# Patient Record
Sex: Male | Born: 1995
Health system: Southern US, Community
[De-identification: ages and names within clinical notes are randomized; demographics above are authoritative.]

## PROBLEM LIST (undated history)

## (undated) DIAGNOSIS — R011 Cardiac murmur, unspecified: Secondary | ICD-10-CM

## (undated) DIAGNOSIS — J45909 Unspecified asthma, uncomplicated: Secondary | ICD-10-CM

## (undated) HISTORY — PX: KNEE ARTHROPLASTY: SHX992

## (undated) HISTORY — PX: TONSILLECTOMY: SUR1361

---
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
## 2013-02-21 ENCOUNTER — Other Ambulatory Visit: Payer: Self-pay

## 2013-02-21 ENCOUNTER — Emergency Department (HOSPITAL_COMMUNITY)
Admission: EM | Admit: 2013-02-21 | Discharge: 2013-02-21 | Disposition: A | Discharge status: Home / Self Care | Payer: BC Managed Care – PPO | Attending: Emergency Medicine | Admitting: Emergency Medicine

## 2013-02-21 ENCOUNTER — Other Ambulatory Visit: Payer: BC Managed Care – PPO

## 2013-02-21 ENCOUNTER — Encounter (HOSPITAL_COMMUNITY): Payer: Self-pay | Admitting: Emergency Medicine

## 2013-02-21 ENCOUNTER — Emergency Department (HOSPITAL_COMMUNITY): Payer: BC Managed Care – PPO

## 2013-02-21 DIAGNOSIS — M545 Low back pain, unspecified: Secondary | ICD-10-CM | POA: Insufficient documentation

## 2013-02-21 DIAGNOSIS — R0789 Other chest pain: Secondary | ICD-10-CM

## 2013-02-21 DIAGNOSIS — R05 Cough: Secondary | ICD-10-CM | POA: Insufficient documentation

## 2013-02-21 DIAGNOSIS — J45901 Unspecified asthma with (acute) exacerbation: Secondary | ICD-10-CM | POA: Insufficient documentation

## 2013-02-21 DIAGNOSIS — R5381 Other malaise: Secondary | ICD-10-CM | POA: Insufficient documentation

## 2013-02-21 DIAGNOSIS — Z791 Long term (current) use of non-steroidal anti-inflammatories (NSAID): Secondary | ICD-10-CM | POA: Insufficient documentation

## 2013-02-21 DIAGNOSIS — R011 Cardiac murmur, unspecified: Secondary | ICD-10-CM | POA: Insufficient documentation

## 2013-02-21 DIAGNOSIS — R5383 Other fatigue: Secondary | ICD-10-CM

## 2013-02-21 DIAGNOSIS — R51 Headache: Secondary | ICD-10-CM | POA: Insufficient documentation

## 2013-02-21 DIAGNOSIS — R071 Chest pain on breathing: Secondary | ICD-10-CM | POA: Insufficient documentation

## 2013-02-21 DIAGNOSIS — R059 Cough, unspecified: Secondary | ICD-10-CM | POA: Insufficient documentation

## 2013-02-21 MED ORDER — ALBUTEROL SULFATE HFA 108 (90 BASE) MCG/ACT IN AERS: 1.0000 | INHALATION_SPRAY | Freq: Four times a day (QID) | RESPIRATORY_TRACT | Status: DC | PRN

## 2013-02-21 MED ORDER — NAPROXEN 500 MG PO TABS: 500.0000 mg | ORAL_TABLET | Freq: Two times a day (BID) | ORAL | Status: DC

## 2013-02-21 NOTE — ED Notes (Signed)
Pt c/o chest tightness and difficult breathing since late last night. Pt also c/o productive cough with green/yellow sputum x1-2 weeks. Pt reports hx of asthma but states he is currently out of his inhaler.

## 2013-02-21 NOTE — Discharge Instructions (Signed)

## 2013-02-21 NOTE — ED Notes (Signed)
nad noted prior to dc. Dc instructions reviewed and explained. Voiced understanding. 1 script given. Family at [facility]sd

## 2013-02-21 NOTE — ED Provider Notes (Signed)
CSN: [id number]     Arrival date & time 02/21/13  0719 History  This chart was scribed for Shelda JakesScott W. Vin Yonke, MD,  by Ashley JacobsBrittany Andrews, ED Scribe. The patient was seen in room APA12/APA12 and the patient's care was started at 7:39 AM.    First MD Initiated Contact with Patient 02/21/13 [phone]     Chief Complaint  Patient presents with  . Chest Pain  . Cough   (Consider location/radiation/quality/duration/timing/severity/associated sxs/prior Treatment) Patient is a 18 y.o. male presenting with chest pain and cough. The history is provided by the patient and medical records. No language interpreter was used.  Chest Pain Associated symptoms: back pain (lumbar), cough, headache and shortness of breath   Associated symptoms: no dizziness, no fever, no nausea and not vomiting   Cough Associated symptoms: chest pain, headaches, myalgias and shortness of breath   Associated symptoms: no chills, no fever and no rash    HPI Comments: Alex Oneill is a 18 y.o. male who presents to the Emergency Department complaining of constant, moderate, sharp chest pain. The pain is 8/10 in severity and worse with breathing or coughing. The pain was intermittent for two weeks prior to yesterday. He has the associated symptoms of SOB, congestion, back pain, body aches, and headaches. Denies dizziness, nausea, chills, and fever. Pt had a cough for one week. Pt has a past medical hx of heart murmur and asthma. Nothing seems to resolve his symptoms. Pt does not have any known allergies to medications.  Past Medical History  Diagnosis Date  . Heart murmur   . Asthma    Past Surgical History  Procedure Laterality Date  . Knee arthroplasty    . Tonsillectomy     No family history on file. History  Substance Use Topics  . Smoking status: Never Smoker   . Smokeless tobacco: Not on file  . Alcohol Use: No    Review of Systems  Constitutional: Negative for fever and chills.  HENT: Positive for congestion.    Eyes: Negative for visual disturbance.  Respiratory: Positive for cough and shortness of breath.   Cardiovascular: Positive for chest pain. Negative for leg swelling.  Gastrointestinal: Negative for nausea, vomiting and diarrhea.  Musculoskeletal: Positive for back pain (lumbar) and myalgias.  Skin: Negative for rash.  Neurological: Positive for headaches. Negative for dizziness.  Hematological: Does not bruise/bleed easily.  Psychiatric/Behavioral: Negative for confusion.  All other systems reviewed and are negative.    Allergies  Review of patient's allergies indicates no known allergies.  Home Medications   Current Outpatient Rx  Name  Route  Sig  Dispense  Refill  . naproxen (NAPROSYN) 500 MG tablet   Oral   Take 1 tablet (500 mg total) by mouth 2 (two) times daily.   14 tablet   0    BP 134/94  Pulse 64  Temp(Src) 98 F (36.7 C) (Oral)  Resp 16  Wt 165 lb (74.844 kg)  SpO2 99% Physical Exam  Nursing note and vitals reviewed. Constitutional: He appears well-developed and well-nourished. No distress.  Cardiovascular: Normal rate, regular rhythm and normal heart sounds.  Exam reveals no gallop and no friction rub.   No murmur heard. Pulmonary/Chest: Breath sounds normal. He has no wheezes. He has no rales. He exhibits tenderness.  Reproducible chest wall tenderness  Abdominal: Soft. Bowel sounds are normal. There is no tenderness. There is no rebound and no guarding.  Musculoskeletal: Normal range of motion. He exhibits no edema and no  tenderness.    ED Course  Procedures (including critical care time) DIAGNOSTIC STUDIES: Oxygen Saturation is 99% on room air, normal by my interpretation.    COORDINATION OF CARE:  7:44 AM Discussed course of care with pt . Pt understands and agrees.   Labs Review Labs Reviewed - No data to display Imaging Review Dg Chest 2 View  02/21/2013   CLINICAL DATA:  Chest pain and productive cough.  EXAM: CHEST  2 VIEW  COMPARISON:   12/01/2011  FINDINGS: The heart size and mediastinal contours are within normal limits. Both lungs are clear. The visualized skeletal structures are unremarkable.  IMPRESSION: Normal chest.   Electronically Signed   By: Geanie Cooley M.D.   On: 02/21/2013 08:11    EKG Interpretation   None      Date: 02/21/2013  Rate: 55  Rhythm: sinus bradycardia and sinus arrhythmia  QRS Axis: normal  Intervals: normal  ST/T Wave abnormalities: normal  Conduction Disutrbances:none  Narrative Interpretation:   Old EKG Reviewed: none available    MDM   1. Chest wall pain     Chest x-ray negative EKG without any acute findings. Patient has reproducible left-sided chest tenderness this is most likely all secondary to chest wall pain soreness from the upper respiratory infection and cough for the past several days. No evidence of pneumonia no evidence of pneumothorax.     I personally performed the services described in this documentation, which was scribed in my presence. The recorded information has been reviewed and is accurate.       Shelda Jakes, MD 02/21/13 [phone]

## 2013-02-21 NOTE — ED Notes (Signed)
C/o sharp cp that began yesterday. Sharp in nature per pt

## 2013-04-25 ENCOUNTER — Telehealth: Payer: Self-pay | Admitting: General Practice

## 2013-04-25 NOTE — Telephone Encounter (Signed)
appt scheduled with mae

## 2013-04-29 ENCOUNTER — Ambulatory Visit (INDEPENDENT_AMBULATORY_CARE_PROVIDER_SITE_OTHER): Payer: BC Managed Care – PPO

## 2013-04-29 ENCOUNTER — Encounter: Payer: Self-pay | Admitting: General Practice

## 2013-04-29 ENCOUNTER — Ambulatory Visit (INDEPENDENT_AMBULATORY_CARE_PROVIDER_SITE_OTHER): Payer: BC Managed Care – PPO | Admitting: General Practice

## 2013-04-29 VITALS — BP 133/81 | HR 84 | Temp 98.6°F | Ht 68.68 in | Wt 158.0 lb

## 2013-04-29 DIAGNOSIS — M549 Dorsalgia, unspecified: Secondary | ICD-10-CM

## 2013-04-29 DIAGNOSIS — R52 Pain, unspecified: Secondary | ICD-10-CM

## 2013-04-29 DIAGNOSIS — T148XXA Other injury of unspecified body region, initial encounter: Secondary | ICD-10-CM

## 2013-04-29 MED ORDER — CYCLOBENZAPRINE HCL 10 MG PO TABS: 10.0000 mg | ORAL_TABLET | Freq: Two times a day (BID) | ORAL | Status: DC | PRN

## 2013-04-29 MED ORDER — IBUPROFEN 800 MG PO TABS: 800.0000 mg | ORAL_TABLET | Freq: Two times a day (BID) | ORAL | Status: DC | PRN

## 2013-04-29 NOTE — Patient Instructions (Addendum)
Back Pain, Adult  Low back pain is very common. About 1 in 5 people have back pain. The cause of low back pain is rarely dangerous. The pain often gets better over time. About half of people with a sudden onset of back pain feel better in just 2 weeks. About 8 in 10 people feel better by 6 weeks.   CAUSES  Some common causes of back pain include:  · Strain of the muscles or ligaments supporting the spine.  · Wear and tear (degeneration) of the spinal discs.  · Arthritis.  · Direct injury to the back.  DIAGNOSIS  Most of the time, the direct cause of low back pain is not known. However, back pain can be treated effectively even when the exact cause of the pain is unknown. Answering your caregiver's questions about your overall health and symptoms is one of the most accurate ways to make sure the cause of your pain is not dangerous. If your caregiver needs more information, he or she may order lab work or imaging tests (X-rays or MRIs). However, even if imaging tests show changes in your back, this usually does not require surgery.  HOME CARE INSTRUCTIONS  For many people, back pain returns. Since low back pain is rarely dangerous, it is often a condition that people can learn to manage on their own.   · Remain active. It is stressful on the back to sit or stand in one place. Do not sit, drive, or stand in one place for more than 30 minutes at a time. Take short walks on level surfaces as soon as pain allows. Try to increase the length of time you walk each day.  · Do not stay in bed. Resting more than 1 or 2 days can delay your recovery.  · Do not avoid exercise or work. Your body is made to move. It is not dangerous to be active, even though your back may hurt. Your back will likely heal faster if you return to being active before your pain is gone.  · Pay attention to your body when you  bend and lift. Many people have less discomfort when lifting if they bend their knees, keep the load close to their bodies, and  avoid twisting. Often, the most comfortable positions are those that put less stress on your recovering back.  · Find a comfortable position to sleep. Use a firm mattress and lie on your side with your knees slightly bent. If you lie on your back, put a pillow under your knees.  · Only take over-the-counter or prescription medicines as directed by your caregiver. Over-the-counter medicines to reduce pain and inflammation are often the most helpful. Your caregiver may prescribe muscle relaxant drugs. These medicines help dull your pain so you can more quickly return to your normal activities and healthy exercise.  · Put ice on the injured area.  · Put ice in a plastic bag.  · Place a towel between your skin and the bag.  · Leave the ice on for 15-20 minutes, 03-04 times a day for the first 2 to 3 days. After that, ice and heat may be alternated to reduce pain and spasms.  · Ask your caregiver about trying back exercises and gentle massage. This may be of some benefit.  · Avoid feeling anxious or stressed. Stress increases muscle tension and can worsen back pain. It is important to recognize when you are anxious or stressed and learn ways to manage it. Exercise is a great option.  SEEK MEDICAL CARE IF:  · You have pain that is not relieved with rest or   medicine.  · You have pain that does not improve in 1 week.  · You have new symptoms.  · You are generally not feeling well.  SEEK IMMEDIATE MEDICAL CARE IF:   · You have pain that radiates from your back into your legs.  · You develop new bowel or bladder control problems.  · You have unusual weakness or numbness in your arms or legs.  · You develop nausea or vomiting.  · You develop abdominal pain.  · You feel faint.  Document Released: 01/10/2005 Document Revised: 07/12/2011 Document Reviewed: 05/31/2010  ExitCare® Patient Information ©2014 [employer].    Muscle Strain  A muscle strain is an injury that occurs when a muscle is stretched beyond its normal length.  Usually a small number of muscle fibers are torn when this happens. Muscle strain is rated in degrees. First-degree strains have the least amount of muscle fiber tearing and pain. Second-degree and third-degree strains have increasingly more tearing and pain.   Usually, recovery from muscle strain takes 1 2 weeks. Complete healing takes 5 6 weeks.   CAUSES   Muscle strain happens when a sudden, violent force placed on a muscle stretches it too far. This may occur with lifting, sports, or a fall.   RISK FACTORS  Muscle strain is especially common in athletes.   SIGNS AND SYMPTOMS  At the site of the muscle strain, there may be:  · Pain.  · Bruising.  · Swelling.  · Difficulty using the muscle due to pain or lack of normal function.  DIAGNOSIS   Your health care provider will perform a physical exam and ask about your medical history.  TREATMENT   Often, the best treatment for a muscle strain is resting, icing, and applying cold compresses to the injured area.    HOME CARE INSTRUCTIONS   · Use the PRICE method of treatment to promote muscle healing during the first 2 3 days after your injury. The PRICE method involves:  · Protecting the muscle from being injured again.  · Restricting your activity and resting the injured body part.  · Icing your injury. To do this, put ice in a plastic bag. Place a towel between your skin and the bag. Then, apply the ice and leave it on from 15 20 minutes each hour. After the third day, switch to moist heat packs.  · Apply compression to the injured area with a splint or elastic bandage. Be careful not to wrap it too tightly. This may interfere with blood circulation or increase swelling.  · Elevate the injured body part above the level of your heart as often as you can.  · Only take over-the-counter or prescription medicines for pain, discomfort, or fever as directed by your health care provider.  · Warming up prior to exercise helps to prevent future muscle strains.  SEEK MEDICAL  CARE IF:   · You have increasing pain or swelling in the injured area.  · You have numbness, tingling, or a significant loss of strength in the injured area.  MAKE SURE YOU:   · Understand these instructions.  · Will watch your condition.  · Will get help right away if you are not doing well or get worse.  Document Released: 01/10/2005 Document Revised: 10/31/2012 Document Reviewed: 08/09/2012  ExitCare® Patient Information ©2014 [employer].

## 2013-04-29 NOTE — Progress Notes (Signed)
   Subjective:    Patient ID: Alex Oneill, male    DOB: 08-Sep-1995, 18 y.o.   MRN: [id number]  Back Pain This is a recurrent problem. The current episode started more than 1 year ago. The problem occurs constantly. The problem has been gradually worsening since onset. The pain is present in the lumbar spine. The quality of the pain is described as aching and shooting. The pain does not radiate. The pain is at a severity of 7/10. The pain is the same all the time. Pertinent negatives include no abdominal pain, bladder incontinence, bowel incontinence, chest pain, fever, headaches, leg pain or weakness. He has tried ice, heat, NSAIDs and muscle relaxant for the symptoms. The treatment provided no relief.      Review of Systems  Constitutional: Negative for fever and chills.  Respiratory: Negative for chest tightness and shortness of breath.   Cardiovascular: Negative for chest pain and palpitations.  Gastrointestinal: Negative for nausea, vomiting, abdominal pain, constipation and bowel incontinence.  Genitourinary: Negative for bladder incontinence.  Musculoskeletal: Positive for back pain.  Neurological: Negative for dizziness, weakness and headaches.       Objective:   Physical Exam  Constitutional: He is oriented to person, place, and time. He appears well-developed and well-nourished.  Cardiovascular: Normal rate, regular rhythm and normal heart sounds.   Pulmonary/Chest: Effort normal and breath sounds normal. No respiratory distress. He exhibits no tenderness.  Musculoskeletal: He exhibits tenderness.  Lumbar area tightness and tenderness with palpation.   Neurological: He is alert and oriented to person, place, and time.  Skin: Skin is warm and dry.  Psychiatric: He has a normal mood and affect.    [facility] reading (PRIMARY) by Coralie KeensMae E. Keilan Nichol, FNP-C, no acute fracture or dislocation.       Assessment & Plan:  1. Back pain, 2. Pain  - DG Lumbar Spine Complete;  Future  3. Muscle strain  - cyclobenzaprine (FLEXERIL) 10 MG tablet; Take 1 tablet (10 mg total) by mouth 2 (two) times daily as needed for muscle spasms.  Dispense: 30 tablet; Refill: 0 - ibuprofen (ADVIL,MOTRIN) 800 MG tablet; Take 1 tablet (800 mg total) by mouth every 12 (twelve) hours as needed.  Dispense: 30 tablet; Refill: 0 -patient home care instructions provided and discussed  -[facility] office for follow up in 2 weeks and sooner if symptoms worsen -will refer to ortho -Patient and mother verbalized understanding Coralie KeensMae E. Merlean Pizzini, FNP-C

## 2013-05-27 ENCOUNTER — Telehealth: Payer: Self-pay | Admitting: General Practice

## 2013-05-27 DIAGNOSIS — M549 Dorsalgia, unspecified: Secondary | ICD-10-CM

## 2013-05-28 NOTE — Telephone Encounter (Signed)
Ortho referral placed.  Patient aware that it may take up to a week to be scheduled.

## 2013-11-26 ENCOUNTER — Encounter (HOSPITAL_COMMUNITY): Payer: Self-pay | Admitting: Emergency Medicine

## 2013-11-26 ENCOUNTER — Emergency Department (HOSPITAL_COMMUNITY)
Admission: EM | Admit: 2013-11-26 | Discharge: 2013-11-26 | Disposition: A | Discharge status: Home / Self Care | Payer: BC Managed Care – PPO | Attending: Emergency Medicine | Admitting: Emergency Medicine

## 2013-11-26 DIAGNOSIS — R51 Headache: Secondary | ICD-10-CM | POA: Diagnosis present

## 2013-11-26 DIAGNOSIS — Z880 Allergy status to penicillin: Secondary | ICD-10-CM | POA: Diagnosis not present

## 2013-11-26 DIAGNOSIS — R011 Cardiac murmur, unspecified: Secondary | ICD-10-CM | POA: Insufficient documentation

## 2013-11-26 DIAGNOSIS — Z79899 Other long term (current) drug therapy: Secondary | ICD-10-CM | POA: Insufficient documentation

## 2013-11-26 DIAGNOSIS — G43909 Migraine, unspecified, not intractable, without status migrainosus: Secondary | ICD-10-CM | POA: Insufficient documentation

## 2013-11-26 DIAGNOSIS — J45909 Unspecified asthma, uncomplicated: Secondary | ICD-10-CM | POA: Insufficient documentation

## 2013-11-26 DIAGNOSIS — R519 Headache, unspecified: Secondary | ICD-10-CM

## 2013-11-26 MED ORDER — METOCLOPRAMIDE HCL 5 MG/ML IJ SOLN
10.0000 mg | Freq: Once | INTRAMUSCULAR | Status: AC
  Administered 2013-11-26: 10 mg via INTRAVENOUS
  Filled 2013-11-26: qty 2

## 2013-11-26 MED ORDER — DIPHENHYDRAMINE HCL 50 MG/ML IJ SOLN
25.0000 mg | Freq: Once | INTRAMUSCULAR | Status: AC
  Administered 2013-11-26: 25 mg via INTRAVENOUS
  Filled 2013-11-26: qty 1

## 2013-11-26 MED ORDER — SODIUM CHLORIDE 0.9 % IV SOLN
1000.0000 mL | INTRAVENOUS | Status: DC
  Administered 2013-11-26: 1000 mL via INTRAVENOUS

## 2013-11-26 MED ORDER — SODIUM CHLORIDE 0.9 % IV SOLN: 1000.0000 mL | Freq: Once | INTRAVENOUS | Status: DC

## 2013-11-26 MED ORDER — METOCLOPRAMIDE HCL 10 MG PO TABS: 10.0000 mg | ORAL_TABLET | Freq: Four times a day (QID) | ORAL | Status: DC | PRN

## 2013-11-26 NOTE — ED Provider Notes (Signed)
CSN: [id number]     Arrival date & time 11/26/13  0028 History   First MD Initiated Contact with Patient 11/26/13 0201     Chief Complaint  Patient presents with  . Headache     (Consider location/radiation/quality/duration/timing/severity/associated sxs/prior Treatment) Patient is a 18 y.o. male presenting with headaches. The history is provided by the patient.  Headache He has a history of migraine headaches and states that he headache which started about 5 PM. It is bifrontal and described as a throbbing pain which he rates at 7/10. Nothing makes it better nothing makes it worse. He denies phonophobia and photophobia. There is no visual disturbance. He denies fever or chills. He denies stiff neck. He took ibuprofen with no relief.  Past Medical History  Diagnosis Date  . Heart murmur   . Asthma    Past Surgical History  Procedure Laterality Date  . Knee arthroplasty    . Tonsillectomy     History reviewed. No pertinent family history. History  Substance Use Topics  . Smoking status: Never Smoker   . Smokeless tobacco: Not on file  . Alcohol Use: No    Review of Systems  Neurological: Positive for headaches.  All other systems reviewed and are negative.     Allergies  Penicillins  Home Medications   Prior to Admission medications   Medication Sig Start Date End Date Taking? Authorizing Provider  albuterol (PROVENTIL HFA;VENTOLIN HFA) 108 (90 BASE) MCG/ACT inhaler Inhale 1-2 puffs into the lungs every 6 (six) hours as needed for wheezing or shortness of breath. 02/21/13  Yes Vanetta MuldersScott Zackowski, MD  cyclobenzaprine (FLEXERIL) 10 MG tablet Take 1 tablet (10 mg total) by mouth 2 (two) times daily as needed for muscle spasms. 04/29/13   Coralie KeensMae E [place]ib[place]ton, FNP  ibuprofen (ADVIL,MOTRIN) 800 MG tablet Take 1 tablet (800 mg total) by mouth every 12 (twelve) hours as needed. 04/29/13   Mae Shelda AltesE Halib[place]ton, FNP   BP 119/72 mmHg  Pulse 50  Temp(Src) 98.6 F (37 C)  Resp 18  Ht 5\' 8"   (1.727 m)  Wt 165 lb (74.844 kg)  BMI 25.09 kg/m2  SpO2 100% Physical Exam  Nursing note and vitals reviewed.  18 year old male, resting comfortably and in no acute distress. Vital signs are significant for bradycardia. Oxygen saturation is 100%, which is normal. Head is normocephalic and atraumatic. PERRLA, EOMI. Oropharynx is clear. Fundi show no hemorrhage, exudate, or papilledema. There is no tenderness over the frontalis and temporalis muscles. Neck is nontender and supple without adenopathy or JVD. There is no tenderness over the paracervical muscles. Back is nontender and there is no CVA tenderness. Lungs are clear without rales, wheezes, or rhonchi. Chest is nontender. Heart has regular rate and rhythm without murmur. Abdomen is soft, flat, nontender without masses or hepatosplenomegaly and peristalsis is normoactive. Extremities have no cyanosis or edema, full range of motion is present. Skin is warm and dry without rash. Neurologic: Mental status is normal, cranial nerves are intact, there are no motor or sensory deficits.  ED Course  Procedures (including critical care time)  MDM   Final diagnoses:  Headache, unspecified headache type    Headache which may be a migraine. Although it is not the typical without photophobia and phonophobia. He will be given a migraine cocktail and reassessed. Old records are reviewed and I see no prior ED visits for headaches.  He had excellent relief of his headache with them metoclopramide and is discharged to the prescription  for same.  Dione Boozeavid Jalexa Pifer, MD 11/26/13 [phone]

## 2013-11-26 NOTE — Discharge Instructions (Signed)
General Headache Without Cause A headache is pain or discomfort felt around the head or neck area. The specific cause of a headache may not be found. There are many causes and types of headaches. A few common ones are:  Tension headaches.  Migraine headaches.  Cluster headaches.  Chronic daily headaches. HOME CARE INSTRUCTIONS   Keep all follow-up appointments with your caregiver or any specialist referral.  Only take over-the-counter or prescription medicines for pain or discomfort as directed by your caregiver.  Lie down in a dark, quiet room when you have a headache.  Keep a headache journal to find out what may trigger your migraine headaches. For example, write down:  What you eat and drink.  How much sleep you get.  Any change to your diet or medicines.  Try massage or other relaxation techniques.  Put ice packs or heat on the head and neck. Use these 3 to 4 times per day for 15 to 20 minutes each time, or as needed.  Limit stress.  Sit up straight, and do not tense your muscles.  Quit smoking if you smoke.  Limit alcohol use.  Decrease the amount of caffeine you drink, or stop drinking caffeine.  Eat and sleep on a regular schedule.  Get 7 to 9 hours of sleep, or as recommended by your caregiver.  Keep lights dim if bright lights bother you and make your headaches worse. SEEK MEDICAL CARE IF:   You have problems with the medicines you were prescribed.  Your medicines are not working.  You have a change from the usual headache.  You have nausea or vomiting. SEEK IMMEDIATE MEDICAL CARE IF:   Your headache becomes severe.  You have a fever.  You have a stiff neck.  You have loss of vision.  You have muscular weakness or loss of muscle control.  You start losing your balance or have trouble walking.  You feel faint or pass out.  You have severe symptoms that are different from your first symptoms. MAKE SURE YOU:   Understand these  instructions.  Will watch your condition.  Will get help right away if you are not doing well or get worse. Document Released: 01/10/2005 Document Revised: 04/04/2011 Document Reviewed: 01/26/2011 [facility] Patient Information 2015 [place], [place]. This information is not intended to replace advice given to you by your health care provider. Make sure you discuss any questions you have with your health care provider.  Metoclopramide tablets What is this medicine? METOCLOPRAMIDE (met oh kloe PRA mide) is used to treat the symptoms of gastroesophageal reflux disease (GERD) like heartburn. It is also used to treat people with slow emptying of the stomach and intestinal tract. This medicine may be used for other purposes; ask your health care provider or pharmacist if you have questions. COMMON BRAND NAME(S): Reglan What should I tell my health care provider before I take this medicine? They need to know if you have any of these conditions: -breast cancer -depression -diabetes -heart failure -high blood pressure -kidney disease -liver disease -Parkinson's disease or a movement disorder -pheochromocytoma -seizures -stomach obstruction, bleeding, or perforation -an unusual or allergic reaction to metoclopramide, procainamide, sulfites, other medicines, foods, dyes, or preservatives -pregnant or trying to get pregnant -breast-feeding How should I use this medicine? Take this medicine by mouth with a glass of water. Follow the directions on the prescription label. Take this medicine on an empty stomach, about 30 minutes before eating. Take your doses at regular intervals. Do not take  your medicine more often than directed. Do not stop taking except on the advice of your doctor or health care professional. A special MedGuide will be given to you by the pharmacist with each prescription and refill. Be sure to read this information carefully each time. Talk to your pediatrician regarding the use  of this medicine in children. Special care may be needed. Overdosage: If you think you have taken too much of this medicine contact a poison control center or emergency room at once. NOTE: This medicine is only for you. Do not share this medicine with others. What if I miss a dose? If you miss a dose, take it as soon as you can. If it is almost time for your next dose, take only that dose. Do not take double or extra doses. What may interact with this medicine? -acetaminophen -cyclosporine -digoxin -medicines for blood pressure -medicines for diabetes, including insulin -medicines for hay fever and other allergies -medicines for depression, especially an Monoamine Oxidase Inhibitor (MAOI) -medicines for Parkinson's disease, like levodopa -medicines for sleep or for pain -tetracycline This list may not describe all possible interactions. Give your health care provider a list of all the medicines, herbs, non-prescription drugs, or dietary supplements you use. Also tell them if you smoke, drink alcohol, or use illegal drugs. Some items may interact with your medicine. What should I watch for while using this medicine? It may take a few weeks for your stomach condition to start to get better. However, do not take this medicine for longer than 12 weeks. The longer you take this medicine, and the more you take it, the greater your chances are of developing serious side effects. If you are an elderly patient, a male patient, or you have diabetes, you may be at an increased risk for side effects from this medicine. Contact your doctor immediately if you start having movements you cannot control such as lip smacking, rapid movements of the tongue, involuntary or uncontrollable movements of the eyes, head, arms and legs, or muscle twitches and spasms. Patients and their families should watch out for worsening depression or thoughts of suicide. Also watch out for any sudden or severe changes in feelings  such as feeling anxious, agitated, panicky, irritable, hostile, aggressive, impulsive, severely restless, overly excited and hyperactive, or not being able to sleep. If this happens, especially at the beginning of treatment or after a change in dose, call your doctor. Do not treat yourself for high fever. Ask your doctor or health care professional for advice. You may get drowsy or dizzy. Do not drive, use machinery, or do anything that needs mental alertness until you know how this drug affects you. Do not stand or sit up quickly, especially if you are an older patient. This reduces the risk of dizzy or fainting spells. Alcohol can make you more drowsy and dizzy. Avoid alcoholic drinks. What side effects may I notice from receiving this medicine? Side effects that you should report to your doctor or health care professional as soon as possible: -allergic reactions like skin rash, itching or hives, swelling of the face, lips, or tongue -abnormal production of milk in females -breast enlargement in both males and females -change in the way you walk -difficulty moving, speaking or swallowing -drooling, lip smacking, or rapid movements of the tongue -excessive sweating -fever -involuntary or uncontrollable movements of the eyes, head, arms and legs -irregular heartbeat or palpitations -muscle twitches and spasms -unusually weak or tired Side effects that usually do not  require medical attention (report to your doctor or health care professional if they continue or are bothersome): -change in sex drive or performance -depressed mood -diarrhea -difficulty sleeping -headache -menstrual changes -restless or nervous This list may not describe all possible side effects. Call your doctor for medical advice about side effects. You may report side effects to FDA at 1-800-FDA-1088. Where should I keep my medicine? Keep out of the reach of children. Store at room temperature between 20 and 25 degrees C  (68 and 77 degrees F). Protect from light. Keep container tightly closed. Throw away any unused medicine after the expiration date. NOTE: This sheet is a summary. It may not cover all possible information. If you have questions about this medicine, talk to your doctor, pharmacist, or health care provider.  2015, Elsevier/[employer] Standard. (2011-05-10 13:04:38)

## 2013-11-26 NOTE — ED Notes (Signed)
Pt c/o headache and has been vomiting.

## 2014-10-30 ENCOUNTER — Encounter (HOSPITAL_COMMUNITY): Payer: Self-pay

## 2014-10-30 ENCOUNTER — Emergency Department (HOSPITAL_COMMUNITY): Payer: BLUE CROSS/BLUE SHIELD

## 2014-10-30 ENCOUNTER — Emergency Department (HOSPITAL_COMMUNITY)
Admission: EM | Admit: 2014-10-30 | Discharge: 2014-10-30 | Disposition: A | Discharge status: Home / Self Care | Payer: BLUE CROSS/BLUE SHIELD | Attending: Emergency Medicine | Admitting: Emergency Medicine

## 2014-10-30 DIAGNOSIS — Z79899 Other long term (current) drug therapy: Secondary | ICD-10-CM | POA: Diagnosis not present

## 2014-10-30 DIAGNOSIS — R011 Cardiac murmur, unspecified: Secondary | ICD-10-CM | POA: Diagnosis not present

## 2014-10-30 DIAGNOSIS — R079 Chest pain, unspecified: Secondary | ICD-10-CM | POA: Diagnosis present

## 2014-10-30 DIAGNOSIS — R0789 Other chest pain: Secondary | ICD-10-CM

## 2014-10-30 DIAGNOSIS — J45909 Unspecified asthma, uncomplicated: Secondary | ICD-10-CM | POA: Insufficient documentation

## 2014-10-30 DIAGNOSIS — Z880 Allergy status to penicillin: Secondary | ICD-10-CM | POA: Diagnosis not present

## 2014-10-30 MED ORDER — KETOROLAC TROMETHAMINE 60 MG/2ML IM SOLN
60.0000 mg | Freq: Once | INTRAMUSCULAR | Status: AC
  Administered 2014-10-30: 60 mg via INTRAMUSCULAR
  Filled 2014-10-30: qty 2

## 2014-10-30 MED ORDER — NAPROXEN 500 MG PO TABS: 500.0000 mg | ORAL_TABLET | Freq: Two times a day (BID) | ORAL | Status: DC

## 2014-10-30 NOTE — ED Notes (Signed)
Patient given discharge instruction, verbalized understand. Patient ambulatory out of the department.  

## 2014-10-30 NOTE — Discharge Instructions (Signed)
Chest Wall Pain Chest wall pain is pain in or around the bones and muscles of your chest. Sometimes, an injury causes this pain. Sometimes, the cause may not be known. This pain may take several weeks or longer to get better. HOME CARE INSTRUCTIONS  Pay attention to any changes in your symptoms. Take these actions to help with your pain:   Rest as told by your health care provider.   Avoid activities that cause pain. These include any activities that use your chest muscles or your abdominal and side muscles to lift heavy items.   If directed, apply ice to the painful area:  Put ice in a plastic bag.  Place a towel between your skin and the bag.  Leave the ice on for 20 minutes, 2-3 times per day.  Take over-the-counter and prescription medicines only as told by your health care provider.  Do not use tobacco products, including cigarettes, chewing tobacco, and e-cigarettes. If you need help quitting, ask your health care provider.  Keep all follow-up visits as told by your health care provider. This is important. SEEK MEDICAL CARE IF:  You have a fever.  Your chest pain becomes worse.  You have new symptoms. SEEK IMMEDIATE MEDICAL CARE IF:  You have nausea or vomiting.  You feel sweaty or light-headed.  You have a cough with phlegm (sputum) or you cough up blood.  You develop shortness of breath.   This information is not intended to replace advice given to you by your health care provider. Make sure you discuss any questions you have with your health care provider.   Document Released: 01/10/2005 Document Revised: 10/01/2014 Document Reviewed: 04/07/2014 Elsevier Interactive Patient Education 2016 [employer]  Naproxen and naproxen sodium oral immediate-release tablets What is this medicine? NAPROXEN (na PROX en) is a non-steroidal anti-inflammatory drug (NSAID). It is used to reduce swelling and to treat pain. This medicine may be used for dental pain, headache,  or painful monthly periods. It is also used for painful joint and muscular problems such as arthritis, tendinitis, bursitis, and gout. This medicine may be used for other purposes; ask your health care provider or pharmacist if you have questions. What should I tell my health care provider before I take this medicine? They need to know if you have any of these conditions: -asthma -cigarette smoker -drink more than 3 alcohol containing drinks a day -heart disease or circulation problems such as heart failure or leg edema (fluid retention) -high blood pressure -kidney disease -liver disease -stomach bleeding or ulcers -an unusual or allergic reaction to naproxen, aspirin, other NSAIDs, other medicines, foods, dyes, or preservatives -pregnant or trying to get pregnant -breast-feeding How should I use this medicine? Take this medicine by mouth with a glass of water. Follow the directions on the prescription label. Take it with food if your stomach gets upset. Try to not lie down for at least 10 minutes after you take it. Take your medicine at regular intervals. Do not take your medicine more often than directed. Long-term, continuous use may increase the risk of heart attack or stroke. A special MedGuide will be given to you by the pharmacist with each prescription and refill. Be sure to read this information carefully each time. Talk to your pediatrician regarding the use of this medicine in children. Special care may be needed. Overdosage: If you think you have taken too much of this medicine contact a poison control center or emergency room at once. NOTE: This medicine is  only for you. Do not share this medicine with others. What if I miss a dose? If you miss a dose, take it as soon as you can. If it is almost time for your next dose, take only that dose. Do not take double or extra doses. What may interact with this  medicine? -alcohol -aspirin -cidofovir -diuretics -lithium -methotrexate -other drugs for inflammation like ketorolac or prednisone -pemetrexed -probenecid -warfarin This list may not describe all possible interactions. Give your health care provider a list of all the medicines, herbs, non-prescription drugs, or dietary supplements you use. Also tell them if you smoke, drink alcohol, or use illegal drugs. Some items may interact with your medicine. What should I watch for while using this medicine? Tell your doctor or health care professional if your pain does not get better. Talk to your doctor before taking another medicine for pain. Do not treat yourself. This medicine does not prevent heart attack or stroke. In fact, this medicine may increase the chance of a heart attack or stroke. The chance may increase with longer use of this medicine and in people who have heart disease. If you take aspirin to prevent heart attack or stroke, talk with your doctor or health care professional. Do not take other medicines that contain aspirin, ibuprofen, or naproxen with this medicine. Side effects such as stomach upset, nausea, or ulcers may be more likely to occur. Many medicines available without a prescription should not be taken with this medicine. This medicine can cause ulcers and bleeding in the stomach and intestines at any time during treatment. Do not smoke cigarettes or drink alcohol. These increase irritation to your stomach and can make it more susceptible to damage from this medicine. Ulcers and bleeding can happen without warning symptoms and can cause death. You may get drowsy or dizzy. Do not drive, use machinery, or do anything that needs mental alertness until you know how this medicine affects you. Do not stand or sit up quickly, especially if you are an older patient. This reduces the risk of dizzy or fainting spells. This medicine can cause you to bleed more easily. Try to avoid damage  to your teeth and gums when you brush or floss your teeth. What side effects may I notice from receiving this medicine? Side effects that you should report to your doctor or health care professional as soon as possible: -black or bloody stools, blood in the urine or vomit -blurred vision -chest pain -difficulty breathing or wheezing -nausea or vomiting -severe stomach pain -skin rash, skin redness, blistering or peeling skin, hives, or itching -slurred speech or weakness on one side of the body -swelling of eyelids, throat, lips -unexplained weight gain or swelling -unusually weak or tired -yellowing of eyes or skin Side effects that usually do not require medical attention (report to your doctor or health care professional if they continue or are bothersome): -constipation -headache -heartburn This list may not describe all possible side effects. Call your doctor for medical advice about side effects. You may report side effects to FDA at 1-800-FDA-1088. Where should I keep my medicine? Keep out of the reach of children. Store at room temperature between 15 and 30 degrees C (59 and 86 degrees F). Keep container tightly closed. Throw away any unused medicine after the expiration date. NOTE: This sheet is a summary. It may not cover all possible information. If you have questions about this medicine, talk to your doctor, pharmacist, or health care provider.  2016, Elsevier/Gold Standard. (2009-01-12 20:10:16) ° °

## 2014-10-30 NOTE — ED Provider Notes (Signed)
CSN: [id number]     Arrival date & time 10/30/14  0218 History   First MD Initiated Contact with Patient 10/30/14 0300     Chief Complaint  Patient presents with  . Chest Pain     (Consider location/radiation/quality/duration/timing/severity/associated sxs/prior Treatment) Patient is a 19 y.o. male presenting with chest pain. The history is provided by the patient.  Chest Pain He complains of right upper anterior chest pain which started at about 11:30 PM while watching television. Pain is sharp and worse with a deep breath. He rates pain at 7/10. There is minimal associated cough which is nonproductive. He denies fever, chills, sweats. He denies dyspnea. Denies nausea, vomiting and denies diaphoresis. Pain is not worse with movement. He denies any recent trauma. He states pain is similar to pain he had when he was seen in emergency department here about 2 years ago. He has not tried any treatment at home.  Past Medical History  Diagnosis Date  . Heart murmur   . Asthma    Past Surgical History  Procedure Laterality Date  . Knee arthroplasty    . Tonsillectomy     No family history on file. Social History  Substance Use Topics  . Smoking status: Never Smoker   . Smokeless tobacco: None  . Alcohol Use: No    Review of Systems  Cardiovascular: Positive for chest pain.  All other systems reviewed and are negative.     Allergies  Penicillins  Home Medications   Prior to Admission medications   Medication Sig Start Date End Date Taking? Authorizing Provider  albuterol (PROVENTIL HFA;VENTOLIN HFA) 108 (90 BASE) MCG/ACT inhaler Inhale 1-2 puffs into the lungs every 6 (six) hours as needed for wheezing or shortness of breath. 02/21/13  Yes Vanetta Mulders, MD  cyclobenzaprine (FLEXERIL) 10 MG tablet Take 1 tablet (10 mg total) by mouth 2 (two) times daily as needed for muscle spasms. 04/29/13   Coralie Keens, FNP  ibuprofen (ADVIL,MOTRIN) 800 MG tablet Take 1 tablet (800 mg  total) by mouth every 12 (twelve) hours as needed. 04/29/13   Coralie Keens, FNP  metoCLOPramide (REGLAN) 10 MG tablet Take 1 tablet (10 mg total) by mouth every 6 (six) hours as needed for nausea (or headache). 11/26/13   Dione Booze, MD   BP 127/77 mmHg  Pulse 49  Temp(Src) 97.9 F (36.6 C) (Oral)  Resp 18  Ht  (1.727 m)  Wt 160 lb (72.576 kg)  BMI 24.33 kg/m2  SpO2 100% Physical Exam  Nursing note and vitals reviewed.  19 year old male, resting comfortably and in no acute distress. Vital signs are significant for bradycardia. Oxygen saturation is 100%, which is normal. Head is normocephalic and atraumatic. PERRLA, EOMI. Oropharynx is clear. Neck is nontender and supple without adenopathy or JVD. Back is nontender and there is no CVA tenderness. Lungs are clear without rales, wheezes, or rhonchi. Chest is tender in the right anterior chest wall which does reproduce his pain. Heart has regular rate and rhythm without murmur. Abdomen is soft, flat, nontender without masses or hepatosplenomegaly and peristalsis is normoactive. Extremities have no cyanosis or edema, full range of motion is present. Skin is warm and dry without rash. Neurologic: Mental status is normal, cranial nerves are intact, there are no motor or sensory deficits.  ED Course  Procedures (including critical care time)  Imaging Review Dg Chest 2 View  10/30/2014   CLINICAL DATA:  Acute onset right-sided chest pain. Mild shortness  of breath. Initial encounter.  EXAM: CHEST  2 VIEW  COMPARISON:  Chest radiograph from 02/21/2013  FINDINGS: The lungs are well-aerated and clear. There is no evidence of focal opacification, pleural effusion or pneumothorax.  The heart is normal in size; the mediastinal contour is within normal limits. No acute osseous abnormalities are seen.  IMPRESSION: No acute cardiopulmonary process seen.   Electronically Signed   By: Roanna Raider M.D.   On: 10/30/2014 03:48   I have personally  reviewed and evaluated these images as part of my medical decision-making.   EKG Interpretation   Date/Time:  Thursday October 30 2014 03:05:02 EDT Ventricular Rate:  41 PR Interval:    QRS Duration: 95 QT Interval:  485 QTC Calculation: 400 R Axis:   79 Text Interpretation:  Junctional rhythm RSR' in V1 or V2, probably normal  variant ST elev, probable normal early repol pattern When compared with  ECG of 02/21/2013, No significant change was found Confirmed by [facility]  MD,  Abrial Arrighi ([id number]) on 10/30/2014 3:09:11 AM      MDM   Final diagnoses:  Chest wall pain    Chest pain which appears to be chest wall pain. All records were reviewed and he has a prior ED visit for chest wall pain. He'll be given an injection of ketorolac and chest x-ray will be obtained. No indication for laboratory testing at this point.  Chest x-ray is unremarkable and he got good relief of pain with ketorolac. He is discharged with prescription for naproxen.  Dione Booze, MD 10/30/14 [phone]

## 2014-10-30 NOTE — ED Notes (Signed)
Pt reports right chest pain that started approx 2330 last night, states it feels like a "throbbing" and is constant, mild sob earlier

## 2015-11-19 ENCOUNTER — Emergency Department (HOSPITAL_COMMUNITY)
Admission: EM | Admit: 2015-11-19 | Discharge: 2015-11-19 | Disposition: A | Discharge status: Home / Self Care | Payer: BLUE CROSS/BLUE SHIELD | Attending: Emergency Medicine | Admitting: Emergency Medicine

## 2015-11-19 ENCOUNTER — Encounter (HOSPITAL_COMMUNITY): Payer: Self-pay | Admitting: Emergency Medicine

## 2015-11-19 ENCOUNTER — Emergency Department (HOSPITAL_COMMUNITY): Payer: BLUE CROSS/BLUE SHIELD

## 2015-11-19 DIAGNOSIS — A09 Infectious gastroenteritis and colitis, unspecified: Secondary | ICD-10-CM | POA: Diagnosis not present

## 2015-11-19 DIAGNOSIS — Z79899 Other long term (current) drug therapy: Secondary | ICD-10-CM | POA: Diagnosis not present

## 2015-11-19 DIAGNOSIS — R079 Chest pain, unspecified: Secondary | ICD-10-CM | POA: Insufficient documentation

## 2015-11-19 DIAGNOSIS — J45909 Unspecified asthma, uncomplicated: Secondary | ICD-10-CM | POA: Insufficient documentation

## 2015-11-19 DIAGNOSIS — R1084 Generalized abdominal pain: Secondary | ICD-10-CM | POA: Diagnosis present

## 2015-11-19 DIAGNOSIS — R112 Nausea with vomiting, unspecified: Secondary | ICD-10-CM | POA: Insufficient documentation

## 2015-11-19 DIAGNOSIS — R51 Headache: Secondary | ICD-10-CM | POA: Diagnosis not present

## 2015-11-19 DIAGNOSIS — Z791 Long term (current) use of non-steroidal anti-inflammatories (NSAID): Secondary | ICD-10-CM | POA: Insufficient documentation

## 2015-11-19 LAB — CBC WITH DIFFERENTIAL/PLATELET
Basophils Absolute: 0 10*3/uL (ref 0.0–0.1)
Basophils Relative: 0 %
EOS ABS: 0.3 10*3/uL (ref 0.0–0.7)
EOS PCT: 5 %
HCT: 41.7 % (ref 39.0–52.0)
Hemoglobin: 14.3 g/dL (ref 13.0–17.0)
LYMPHS ABS: 1.7 10*3/uL (ref 0.7–4.0)
Lymphocytes Relative: 26 %
MCH: 26 pg (ref 26.0–34.0)
MCHC: 34.3 g/dL (ref 30.0–36.0)
MCV: 75.7 fL — ABNORMAL LOW (ref 78.0–100.0)
Monocytes Absolute: 0.6 10*3/uL (ref 0.1–1.0)
Monocytes Relative: 9 %
Neutro Abs: 3.9 10*3/uL (ref 1.7–7.7)
Neutrophils Relative %: 60 %
PLATELETS: 154 10*3/uL (ref 150–400)
RBC: 5.51 MIL/uL (ref 4.22–5.81)
RDW: 12.7 % (ref 11.5–15.5)
WBC: 6.5 10*3/uL (ref 4.0–10.5)

## 2015-11-19 LAB — COMPREHENSIVE METABOLIC PANEL
ALT: 15 U/L — AB (ref 17–63)
AST: 18 U/L (ref 15–41)
Albumin: 4.4 g/dL (ref 3.5–5.0)
Alkaline Phosphatase: 28 U/L — ABNORMAL LOW (ref 38–126)
Anion gap: 7 (ref 5–15)
BUN: 12 mg/dL (ref 6–20)
CHLORIDE: 103 mmol/L (ref 101–111)
CO2: 28 mmol/L (ref 22–32)
Calcium: 9.2 mg/dL (ref 8.9–10.3)
Creatinine, Ser: 1.07 mg/dL (ref 0.61–1.24)
GFR calc non Af Amer: >60 mL/min (ref >60)
Glucose, Bld: 96 mg/dL (ref 65–99)
POTASSIUM: 3.4 mmol/L — AB (ref 3.5–5.1)
SODIUM: 138 mmol/L (ref 135–145)
Total Bilirubin: 1.3 mg/dL — ABNORMAL HIGH (ref 0.3–1.2)
Total Protein: 7 g/dL (ref 6.5–8.1)

## 2015-11-19 MED ORDER — KETOROLAC TROMETHAMINE 30 MG/ML IJ SOLN
30.0000 mg | Freq: Once | INTRAMUSCULAR | Status: AC
  Administered 2015-11-19: 30 mg via INTRAVENOUS
  Filled 2015-11-19: qty 1

## 2015-11-19 MED ORDER — SODIUM CHLORIDE 0.9 % IV BOLUS (SEPSIS)
1000.0000 mL | Freq: Once | INTRAVENOUS | Status: AC
  Administered 2015-11-19: 1000 mL via INTRAVENOUS

## 2015-11-19 MED ORDER — ONDANSETRON HCL 4 MG/2ML IJ SOLN
4.0000 mg | Freq: Once | INTRAMUSCULAR | Status: AC
  Administered 2015-11-19: 4 mg via INTRAVENOUS
  Filled 2015-11-19: qty 2

## 2015-11-19 MED ORDER — ONDANSETRON 4 MG PO TBDP: ORAL_TABLET | ORAL | 0 refills | Status: DC

## 2015-11-19 NOTE — ED Provider Notes (Signed)
[facility]-EMERGENCY DEPT Provider Note   CSN: [id number] Arrival date & time: 11/19/15  0957  By signing my name below, I, Placido SouLogan Joldersma, attest that this documentation has been prepared under the direction and in the presence of Bethann BerkshireJoseph Adalida Garver, MD. Electronically Signed: Placido SouLogan Joldersma, ED Scribe. 11/19/15. 10:19 AM.   History   Chief Complaint Chief Complaint  Patient presents with  . Abdominal Pain    HPI HPI Comments: Alex Oneill is a 20 y.o. male with a h/o heart murmur who presents to the Emergency Department complaining of diffuse abdominal pain x 3 days. He reports associated n/v/d, mild CP and a mild HA. He has experienced multiple bouts of diarrhea since last night and 6-7 occurrences of vomiting. Pt denies any known sick contacts but states he works in [employer]. He denies cough and sore throat.    The history is provided by the patient. No language interpreter was used.  Chest Pain  Associated symptoms include abdominal pain, headaches, nausea and vomiting. Pertinent negatives include no back pain and no cough.  Pertinent negatives for past medical history include no seizures.  Abdominal Pain   This is a new problem. The current episode started more than 2 days ago. The problem occurs constantly. The problem has not changed since onset.The pain is located in the generalized abdominal region. The pain is mild. Associated symptoms include diarrhea, nausea, vomiting and headaches. Pertinent negatives include frequency and hematuria.    Past Medical History:  Diagnosis Date  . Asthma   . Heart murmur     There are no active problems to display for this patient.   Past Surgical History:  Procedure Laterality Date  . KNEE ARTHROPLASTY    . TONSILLECTOMY        Home Medications    Prior to Admission medications   Medication Sig Start Date End Date Taking? Authorizing Provider  albuterol (PROVENTIL HFA;VENTOLIN HFA) 108 (90 BASE) MCG/ACT inhaler Inhale 1-2 puffs into  the lungs every 6 (six) hours as needed for wheezing or shortness of breath. 02/21/13   Vanetta MuldersScott Zackowski, MD  cyclobenzaprine (FLEXERIL) 10 MG tablet Take 1 tablet (10 mg total) by mouth 2 (two) times daily as needed for muscle spasms. 04/29/13   Coralie KeensMae E Haliburton, FNP  ibuprofen (ADVIL,MOTRIN) 800 MG tablet Take 1 tablet (800 mg total) by mouth every 12 (twelve) hours as needed. 04/29/13   Coralie KeensMae E Haliburton, FNP  metoCLOPramide (REGLAN) 10 MG tablet Take 1 tablet (10 mg total) by mouth every 6 (six) hours as needed for nausea (or headache). 11/26/13   Dione Boozeavid Glick, MD  naproxen (NAPROSYN) 500 MG tablet Take 1 tablet (500 mg total) by mouth 2 (two) times daily. 10/30/14   Dione Boozeavid Glick, MD    Family History No family history on file.  Social History Social History  Substance Use Topics  . Smoking status: Never Smoker  . Smokeless tobacco: Not on file  . Alcohol use No     Allergies   Penicillins   Review of Systems Review of Systems  Constitutional: Negative for appetite change and fatigue.  HENT: Negative for congestion, ear discharge, sinus pressure and sore throat.   Eyes: Negative for discharge.  Respiratory: Negative for cough.   Cardiovascular: Positive for chest pain.  Gastrointestinal: Positive for abdominal pain, diarrhea, nausea and vomiting.  Genitourinary: Negative for frequency and hematuria.  Musculoskeletal: Negative for back pain.  Skin: Negative for rash.  Neurological: Positive for headaches. Negative for seizures.  Psychiatric/Behavioral: Negative for  hallucinations.   Physical Exam Updated Vital Signs There were no vitals taken for this visit.  Physical Exam  Constitutional: He is oriented to person, place, and time. He appears well-developed.  HENT:  Head: Normocephalic.  Eyes: Conjunctivae and EOM are normal. No scleral icterus.  Neck: Neck supple. No thyromegaly present.  Cardiovascular: Normal rate and regular rhythm.  Exam reveals no gallop and no friction  rub.   No murmur heard. Pulmonary/Chest: No stridor. He has no wheezes. He has no rales. He exhibits no tenderness.  Abdominal: Soft. He exhibits no distension. There is generalized tenderness. There is no rebound.  Musculoskeletal: Normal range of motion. He exhibits no edema.  Lymphadenopathy:    He has no cervical adenopathy.  Neurological: He is oriented to person, place, and time. He exhibits normal muscle tone. Coordination normal.  Skin: No rash noted. No erythema.  Psychiatric: He has a normal mood and affect. His behavior is normal.   ED Treatments / Results  Labs (all labs ordered are listed, but only abnormal results are displayed) Labs Reviewed - No data to display  EKG  EKG Interpretation None       Radiology No results found.  Procedures Procedures  COORDINATION OF CARE: 10:17 AM Discussed next steps with pt. Pt verbalized understanding and is agreeable with the plan.    Medications Ordered in ED Medications - No data to display   Initial Impression / Assessment and Plan / ED Course  I have reviewed the triage vital signs and the nursing notes.  Pertinent labs & imaging results that were available during my care of the patient were reviewed by me and considered in my medical decision making (see chart for details).  Clinical Course  Patient with vomiting and diarrhea. Labs and x-rays unremarkable. Diagnosis gastroenteritis. Patient treated with Zofran Tylenol and Imodium if necessary. Patient is to drink plenty of fluids and follow-up with his PCP if needed Final Clinical Impressions(s) / ED Diagnoses   Final diagnoses:  None    New Prescriptions New Prescriptions   No medications on file     Bethann Berkshire, MD 11/19/15 1240

## 2015-11-19 NOTE — Discharge Instructions (Signed)
Drink plenty of fluids take Motrin or Tylenol for pain. Also he can try some Imodium if the diarrhea gets persistent. Follow-up with her regular doctor next week if not improving

## 2015-11-19 NOTE — ED Triage Notes (Signed)
Pt c/o generalized abd pain with n/v/d x 3 days. Pt also c/o left sided cp since last night. nad noted.

## 2016-03-22 ENCOUNTER — Emergency Department (HOSPITAL_COMMUNITY)
Admission: EM | Admit: 2016-03-22 | Discharge: 2016-03-22 | Disposition: A | Discharge status: Home / Self Care | Payer: BLUE CROSS/BLUE SHIELD | Attending: Emergency Medicine | Admitting: Emergency Medicine

## 2016-03-22 ENCOUNTER — Encounter (HOSPITAL_COMMUNITY): Payer: Self-pay | Admitting: Cardiology

## 2016-03-22 DIAGNOSIS — R21 Rash and other nonspecific skin eruption: Secondary | ICD-10-CM

## 2016-03-22 DIAGNOSIS — J45909 Unspecified asthma, uncomplicated: Secondary | ICD-10-CM | POA: Diagnosis not present

## 2016-03-22 DIAGNOSIS — J029 Acute pharyngitis, unspecified: Secondary | ICD-10-CM | POA: Insufficient documentation

## 2016-03-22 LAB — RAPID STREP SCREEN (MED CTR MEBANE ONLY): Streptococcus, Group A Screen (Direct): NEGATIVE

## 2016-03-22 MED ORDER — PREDNISONE 50 MG PO TABS
60.0000 mg | ORAL_TABLET | Freq: Once | ORAL | Status: AC
  Administered 2016-03-22: 60 mg via ORAL
  Filled 2016-03-22: qty 1

## 2016-03-22 MED ORDER — FAMOTIDINE 20 MG PO TABS
40.0000 mg | ORAL_TABLET | Freq: Once | ORAL | Status: AC
  Administered 2016-03-22: 40 mg via ORAL
  Filled 2016-03-22: qty 2

## 2016-03-22 MED ORDER — PREDNISONE 20 MG PO TABS: 40.0000 mg | ORAL_TABLET | Freq: Every day | ORAL | 0 refills | Status: DC

## 2016-03-22 MED ORDER — DIPHENHYDRAMINE HCL 25 MG PO CAPS
50.0000 mg | ORAL_CAPSULE | Freq: Once | ORAL | Status: AC
  Administered 2016-03-22: 50 mg via ORAL
  Filled 2016-03-22: qty 2

## 2016-03-22 NOTE — Discharge Instructions (Signed)
Take the prescription as directed.  Take over the counter benadryl, as directed on packaging, as needed for itching.  If the benadryl is too sedating, take an over the counter non-sedating antihistamine such as claritin, allegra or zyrtec, as directed on packaging.  Call your regular medical doctor today to schedule a follow up appointment within the week.  Return to the Emergency Department immediately sooner if worsening.

## 2016-03-22 NOTE — ED Provider Notes (Signed)
[facility] Provider Note   CSN: [id number] Arrival date & time: 03/22/16  0710     History   Chief Complaint Chief Complaint  Patient presents with  . Rash    HPI Alex Oneill is a 21 y.o. male.     Pt was seen at 0720. Per pt, c/o gradual onset and persistence of constant "itching rash" that began yesterday. Pt states the rash started on his bilat hands, then "spread all over." Pt describes the rash as "itching" and "burning." Pt did not take any medication to treat his symptoms. Pt also c/o gradual onset and persistence of constant sore throat for the past 2 days. Denies dysphagia, no intra-oral edema, no blisters, no neck pain, no fevers, no SOB/wheezing, no stridor, no eye pain, no abd pain, no N/V/D, no CP/palpitations.   Past Medical History:  Diagnosis Date  . Asthma   . Heart murmur     There are no active problems to display for this patient.   Past Surgical History:  Procedure Laterality Date  . KNEE ARTHROPLASTY    . TONSILLECTOMY         Home Medications    Prior to Admission medications   Medication Sig Start Date End Date Taking? Authorizing Provider  ondansetron (ZOFRAN ODT) 4 MG disintegrating tablet 4mg  ODT q4 hours prn nausea/vomit 11/19/15   Bethann Berkshire, MD    Family History History reviewed. No pertinent family history.  Social History Social History  Substance Use Topics  . Smoking status: Never Smoker  . Smokeless tobacco: Never Used  . Alcohol use No     Allergies   Penicillins   Review of Systems Review of Systems ROS: Statement: All systems negative except as marked or noted in the HPI; Constitutional: Negative for fever and chills. ; ; Eyes: Negative for eye pain, redness and discharge. ; ; ENMT: Negative for ear pain, hoarseness, nasal congestion, sinus pressure and +sore throat. ; ; Cardiovascular: Negative for chest pain, palpitations, diaphoresis, dyspnea and peripheral edema. ; ; Respiratory: Negative for  cough, wheezing and stridor. ; ; Gastrointestinal: Negative for nausea, vomiting, diarrhea, abdominal pain, blood in stool, hematemesis, jaundice and rectal bleeding. . ; ; Genitourinary: Negative for dysuria, flank pain and hematuria. ; ; Musculoskeletal: Negative for back pain and neck pain. Negative for swelling and trauma.; ; Skin: +itching rash. Negative for abrasions, blisters, bruising and skin lesion.; ; Neuro: Negative for headache, lightheadedness and neck stiffness. Negative for weakness, altered level of consciousness, altered mental status, extremity weakness, paresthesias, involuntary movement, seizure and syncope.       Physical Exam Updated Vital Signs BP 140/93 (BP Location: Right Arm)   Pulse (!) 51   Temp 98.6 F (37 C) (Oral)   Resp 16   Ht 5\' 8"  (1.727 m)   Wt 160 lb (72.6 kg)   SpO2 100%   BMI 24.33 kg/m   Physical Exam 0725: Physical examination:  Nursing notes reviewed; Vital signs and O2 SAT reviewed;  Constitutional: Well developed, Well nourished, Well hydrated, In no acute distress; Head:  Normocephalic, atraumatic; Eyes: EOMI, PERRL, No scleral icterus; ENMT: TM's clear bilat. +edemetous nasal turbinates bilat with clear rhinorrhea. Mouth and pharynx without lesions. +erythema to posterior pharynx. No tonsillar exudates. No intra-oral edema. No submandibular or sublingual edema. No hoarse voice, no drooling, no stridor. No pain with manipulation of larynx. No trismus. Mouth and pharynx normal, Mucous membranes moist; Neck: Supple, Full range of motion, No lymphadenopathy. No meningeal signs.;  Cardiovascular: Regular rate and rhythm, No gallop; Respiratory: Breath sounds clear & equal bilaterally, No wheezes.  Speaking full sentences with ease, Normal respiratory effort/excursion; Chest: Nontender, Movement normal; Abdomen: Soft, Nontender, Nondistended, Normal bowel sounds; Genitourinary: No CVA tenderness; Extremities: +small erythematous maculopapular rash to bilat  hands, fingers, forearms. No pustules, no blisters, no petechiae, no ecchymosis, no peeling skin. Pulses normal, No joints tenderness, No edema, No calf edema or asymmetry.; Neuro: AA&Ox3, Major CN grossly intact.  Speech clear. No gross focal motor or sensory deficits in extremities. Climbs on and off stretcher easily by himself. Gait steady.; Skin: Color normal, Warm, Dry.   ED Treatments / Results  Labs (all labs ordered are listed, but only abnormal results are displayed)   EKG  EKG Interpretation None       Radiology   Procedures Procedures (including critical care time)  Medications Ordered in ED Medications - No data to display   Initial Impression / Assessment and Plan / ED Course  I have reviewed the triage vital signs and the nursing notes.  Pertinent labs & imaging results that were available during my care of the patient were reviewed by me and considered in my medical decision making (see chart for details).  MDM Reviewed: previous chart, nursing note and vitals Interpretation: labs    Results for orders placed or performed during the hospital encounter of 03/22/16  Rapid strep screen  Result Value Ref Range   Streptococcus, Group A Screen (Direct) NEGATIVE NEGATIVE    0835:  Strep negative. DDx allergic reaction vs viral illness; doubt syphilis, RMSF, meningitis. Tx for allergic reaction at this time. Return precautions given. Dx and testing d/w pt and family.  Questions answered.  Verb understanding, agreeable to d/c home with outpt f/u.   Final Clinical Impressions(s) / ED Diagnoses   Final diagnoses:  None    New Prescriptions New Prescriptions   No medications on file      Samuel JesterKathleen Sonni Barse, DO 03/27/16 1721

## 2016-03-22 NOTE — ED Triage Notes (Signed)
Rash since yesterday.  Mainly rash is on hands.  C/o hands burning and itching.  Sore throat times 2 days.

## 2016-03-24 LAB — CULTURE, GROUP A STREP (THRC)

## 2016-04-08 ENCOUNTER — Ambulatory Visit: Payer: Self-pay | Admitting: Family

## 2017-10-17 ENCOUNTER — Ambulatory Visit (HOSPITAL_COMMUNITY)
Admission: RE | Admit: 2017-10-17 | Discharge: 2017-10-17 | Disposition: A | Discharge status: Home / Self Care | Payer: BLUE CROSS/BLUE SHIELD | Source: Ambulatory Visit | Attending: Pulmonary Disease | Admitting: Pulmonary Disease

## 2017-10-17 ENCOUNTER — Encounter (HOSPITAL_COMMUNITY): Payer: Self-pay

## 2017-10-17 ENCOUNTER — Other Ambulatory Visit (HOSPITAL_COMMUNITY): Payer: Self-pay | Admitting: Pulmonary Disease

## 2017-10-17 DIAGNOSIS — R079 Chest pain, unspecified: Secondary | ICD-10-CM

## 2018-07-27 ENCOUNTER — Encounter (HOSPITAL_COMMUNITY): Payer: Self-pay | Admitting: Emergency Medicine

## 2018-07-27 ENCOUNTER — Emergency Department (HOSPITAL_COMMUNITY)
Admission: EM | Admit: 2018-07-27 | Discharge: 2018-07-27 | Disposition: A | Discharge status: Home / Self Care | Payer: BC Managed Care – PPO | Attending: Emergency Medicine | Admitting: Emergency Medicine

## 2018-07-27 ENCOUNTER — Other Ambulatory Visit: Payer: Self-pay

## 2018-07-27 ENCOUNTER — Emergency Department (HOSPITAL_COMMUNITY): Payer: BC Managed Care – PPO

## 2018-07-27 DIAGNOSIS — Z96659 Presence of unspecified artificial knee joint: Secondary | ICD-10-CM | POA: Diagnosis not present

## 2018-07-27 DIAGNOSIS — Z79899 Other long term (current) drug therapy: Secondary | ICD-10-CM | POA: Diagnosis not present

## 2018-07-27 DIAGNOSIS — J45909 Unspecified asthma, uncomplicated: Secondary | ICD-10-CM | POA: Diagnosis not present

## 2018-07-27 DIAGNOSIS — R0789 Other chest pain: Secondary | ICD-10-CM | POA: Insufficient documentation

## 2018-07-27 DIAGNOSIS — R079 Chest pain, unspecified: Secondary | ICD-10-CM | POA: Diagnosis present

## 2018-07-27 MED ORDER — KETOROLAC TROMETHAMINE 30 MG/ML IJ SOLN
30.0000 mg | Freq: Once | INTRAMUSCULAR | Status: AC
  Administered 2018-07-27: 30 mg via INTRAMUSCULAR
  Filled 2018-07-27: qty 1

## 2018-07-27 MED ORDER — DICLOFENAC SODIUM 75 MG PO TBEC: 75.0000 mg | DELAYED_RELEASE_TABLET | Freq: Two times a day (BID) | ORAL | 0 refills | Status: DC

## 2018-07-27 MED ORDER — DEXAMETHASONE 4 MG PO TABS: 4.0000 mg | ORAL_TABLET | Freq: Two times a day (BID) | ORAL | 0 refills | Status: DC

## 2018-07-27 MED ORDER — CYCLOBENZAPRINE HCL 10 MG PO TABS
10.0000 mg | ORAL_TABLET | Freq: Once | ORAL | Status: AC
  Administered 2018-07-27: 11:00:00 10 mg via ORAL
  Filled 2018-07-27: qty 1

## 2018-07-27 NOTE — ED Provider Notes (Signed)
[facility]ENCY [facility]PART[facility] Provider Note   CSN: [id number] Arrival date & time: 07/27/18  [id number]     History   Chief Complaint Chief Complaint  Patient presents with  . Chest Pain    HPI Alex CroftKenneth D Oneill is a 23 y.o. male.     Patient is a 23 year old male who presents to the emergency department with generalized chest tightness.  The patient states that this is aggravated by deep breaths and coughing.  It is also aggravated by certain movements.  The patient denies any recent injury or trauma to the chest.  He has not had fever or chills.  No hemoptysis or productive cough reported.  The patient does state that he does a lot of lifting, pushing, pulling, and at times straining.  He says that he has had this situation in the past.  The history is provided by the patient.    Past Medical History:  Diagnosis Date  . Asthma   . Heart murmur     There are no active problems to display for this patient.   Past Surgical History:  Procedure Laterality Date  . KNEE ARTHROPLASTY    . TONSILLECTOMY          Home Medications    Prior to Admission medications   Medication Sig Start Date End Date Taking? Authorizing Provider  albuterol (VENTOLIN HFA) 108 (90 Base) MCG/ACT inhaler Inhale 1-2 puffs into the lungs every 6 (six) hours as needed for wheezing or shortness of breath.   Yes [provider]  ondansetron (ZOFRAN ODT) 4 MG disintegrating tablet 4mg  ODT q4 hours prn nausea/vomit Patient not taking: Reported on 07/27/2018 11/19/15   Bethann BerkshireZammit, Joseph, MD  predniSONE (DELTASONE) 20 MG tablet Take 2 tablets (40 mg total) by mouth daily. Patient not taking: Reported on 07/27/2018 03/22/16   Samuel JesterMcManus, Kathleen, DO    Family History No family history on file.  Social History Social History   Tobacco Use  . Smoking status: Never Smoker  . Smokeless tobacco: Never Used  Substance Use Topics  . Alcohol use: No  . Drug use: No     Allergies   Penicillins    Review of Systems Review of Systems  Constitutional: Negative for activity change.       All ROS Neg except as noted in HPI  HENT: Negative.   Eyes: Negative for photophobia and discharge.  Respiratory: Negative for cough, shortness of breath and wheezing.        Chest wall pain  Cardiovascular: Negative for chest pain and palpitations.  Gastrointestinal: Negative for abdominal pain and blood in stool.  Genitourinary: Negative for dysuria, frequency and hematuria.  Musculoskeletal: Negative for arthralgias, back pain and neck pain.  Skin: Negative.   Neurological: Negative for dizziness, seizures and speech difficulty.  Psychiatric/Behavioral: Negative for confusion and hallucinations.     Physical Exam Updated Vital Signs BP 137/84 (BP Location: Right Arm)   Pulse 63   Temp 97.8 F (36.6 C) (Oral)   Resp 13   Ht 5\' 8"  (1.727 m)   Wt 74.8 kg   SpO2 100%   BMI 25.09 kg/m   Physical Exam Vitals signs and nursing note reviewed.  Constitutional:      Appearance: He is well-developed. He is not toxic-appearing.  HENT:     Head: Normocephalic.     Right Ear: Tympanic membrane and external ear normal.     Left Ear: Tympanic membrane and external ear normal.  Eyes:  General: Lids are normal.     Pupils: Pupils are equal, round, and reactive to light.  Neck:     Musculoskeletal: Normal range of motion and neck supple.     Vascular: No carotid bruit.  Cardiovascular:     Rate and Rhythm: Normal rate and regular rhythm.     Pulses: Normal pulses.     Heart sounds: Normal heart sounds.  Pulmonary:     Effort: No respiratory distress.     Breath sounds: Normal breath sounds.  Chest:     Chest wall: Tenderness present. No crepitus or edema.    Abdominal:     General: Bowel sounds are normal.     Palpations: Abdomen is soft.     Tenderness: There is no abdominal tenderness. There is no guarding.  Musculoskeletal: Normal range of motion.  Lymphadenopathy:     Head:      Right side of head: No submandibular adenopathy.     Left side of head: No submandibular adenopathy.     Cervical: No cervical adenopathy.     Upper Body:     Right upper body: No supraclavicular or axillary adenopathy.     Left upper body: No supraclavicular or axillary adenopathy.  Skin:    General: Skin is warm and dry.  Neurological:     Mental Status: He is alert and oriented to person, place, and time.     Cranial Nerves: No cranial nerve deficit.     Sensory: No sensory deficit.  Psychiatric:        Speech: Speech normal.      ED Treatments / Results  Labs (all labs ordered are listed, but only abnormal results are displayed) Labs Reviewed - No data to display  EKG EKG Interpretation  Date/Time:  Friday July 27 2018 09:30:57 EDT Ventricular Rate:  62 PR Interval:    QRS Duration: 96 QT Interval:  393 QTC Calculation: 399 R Axis:   60 Text Interpretation:  Sinus arrhythmia RSR' in V1 or V2, probably normal variant ST elev, probable normal early repol pattern No significant change since last tracing Confirmed by Fredia Sorrow [phone]) on 07/27/2018 10:02:27 AM   Radiology Dg Chest Portable 1 View  Result Date: 07/27/2018 CLINICAL DATA:  Recurrent chest tightness and mild shortness of breath. EXAM: PORTABLE CHEST 1 VIEW COMPARISON:  Chest x-ray dated October 17, 2017. FINDINGS: The heart size and mediastinal contours are within normal limits. Both lungs are clear. The visualized skeletal structures are unremarkable. IMPRESSION: No active disease. Electronically Signed   By: Titus Dubin M.D.   On: 07/27/2018 10:04    Procedures Procedures (including critical care time)  Medications Ordered in ED Medications - No data to display   Initial Impression / Assessment and Plan / ED Course  I have reviewed the triage vital signs and the nursing notes.  Pertinent labs & imaging results that were available during my care of the patient were reviewed by me and  considered in my medical decision making (see chart for details).          Final Clinical Impressions(s) / ED Diagnoses MDm  Vital signs within normal limits, with the exception of the blood pressure being slightly elevated.  Pulse oximetry is 100% on room air.  Within normal limits by my interpretation.  Chest pain can be reproduced for the most part by palpation of the chest wall both at the sternal area and at the lateral rib area.  Chest x-ray shows no active  disease.  Electrocardiogram shows a normal sinus rhythm.  No evidence of an acute STEMI.  No life-threatening dysrhythmias.  Patient ambulated in the hall with no evidence of desaturation.  Patient ambulated without problem.  Patient will be treated with a short course of steroids and oral NSAIDs.  I have asked the patient to address this situation with his primary physician as he seems to have a recurrence of this about twice a year after reviewing his past records.  Patient is in agreement with this plan.   Final diagnoses:  Chest wall pain    ED Discharge Orders         Ordered    diclofenac (VOLTAREN) 75 MG EC tablet  2 times daily     07/27/18 1325    dexamethasone (DECADRON) 4 MG tablet  2 times daily with meals     07/27/18 1325           Ivery QualeBryant, Calel Pisarski, PA-C 07/27/18 2118    Vanetta MuldersZackowski, Scott, MD 08/04/18 1627

## 2018-07-27 NOTE — ED Notes (Signed)
Patient ambulated in hallway around nurse station. No issues.  HR 60 SpO2 100%  While walking HR 65 SpO2 99%  After walking HR 60 SpO2 100%

## 2018-07-27 NOTE — Discharge Instructions (Signed)
Your vital signs nonacute.  Your oxygen level is 100% on room air.  Your chest x-ray shows no acute abnormality involving your lungs or the bony structures of your chest.  Your electrocardiogram shows a normal sinus rhythm.  There are no life-threatening arrhythmias or evidence of an acute heart situation.  Your examination favors chest wall pain.  Please use Decadron and diclofenac 2 times daily with food.  May use Tylenol extra strength in between these doses if needed for additional pain.  Please see your primary physician at [facility] in [place], or the [facility] here in [place] for additional evaluation concerning the recurrence of this chest wall discomfort.

## 2018-07-27 NOTE — ED Triage Notes (Signed)
Pt c/o recurrent generalized chest tightness  that worsens with deep inspiration and cough. Also endorses mild SOB. Pt states he has asthma and has seen pulmonology for this in the past. States pain over the 3 days has worsened. NAD noted.

## 2018-09-14 ENCOUNTER — Other Ambulatory Visit: Payer: Self-pay

## 2018-09-17 ENCOUNTER — Encounter: Payer: Self-pay | Admitting: Family Medicine

## 2018-09-17 ENCOUNTER — Ambulatory Visit: Payer: BC Managed Care – PPO | Admitting: Family Medicine

## 2018-09-17 ENCOUNTER — Other Ambulatory Visit: Payer: Self-pay

## 2018-09-17 VITALS — BP 125/85 | HR 68 | Temp 98.9°F | Ht 68.0 in | Wt 171.8 lb

## 2018-09-17 DIAGNOSIS — J45909 Unspecified asthma, uncomplicated: Secondary | ICD-10-CM | POA: Insufficient documentation

## 2018-09-17 DIAGNOSIS — M5431 Sciatica, right side: Secondary | ICD-10-CM

## 2018-09-17 DIAGNOSIS — G8929 Other chronic pain: Secondary | ICD-10-CM

## 2018-09-17 DIAGNOSIS — Z23 Encounter for immunization: Secondary | ICD-10-CM | POA: Diagnosis not present

## 2018-09-17 DIAGNOSIS — M546 Pain in thoracic spine: Secondary | ICD-10-CM | POA: Diagnosis not present

## 2018-09-17 DIAGNOSIS — M5432 Sciatica, left side: Secondary | ICD-10-CM

## 2018-09-17 DIAGNOSIS — Z7689 Persons encountering health services in other specified circumstances: Secondary | ICD-10-CM

## 2018-09-17 DIAGNOSIS — J4520 Mild intermittent asthma, uncomplicated: Secondary | ICD-10-CM | POA: Diagnosis not present

## 2018-09-17 MED ORDER — TETANUS-DIPHTH-ACELL PERTUSSIS 5-2.5-18.5 LF-MCG/0.5 IM SUSP: 0.5000 mL | Freq: Once | INTRAMUSCULAR | 0 refills | Status: AC

## 2018-09-17 NOTE — Progress Notes (Signed)
New Patient Office Visit  Assessment & Plan:  1-2. Chronic midline thoracic back pain/Bilateral sciatica - Exercises given for back pain. Education provided on chronic back pain. Ibuprofen (2-3 tablets) 400-600 mg with Tylenol 500 mg every 6-8 hours as needed for pain. Imaging records requested from [facility].  - Ambulatory referral to Physical Therapy - DG Thoracic Spine 2 View; Future - DG Lumbar Spine 2-3 Views; Future  3. Mild intermittent asthma without complication - Well controlled on current regimen.   4. Immunization due - Tdap (BOOSTRIX) 5-2.5-18.5 LF-MCG/0.5 injection; Inject 0.5 mLs into the muscle once for 1 dose.  Dispense: 0.5 mL; Refill: 0  5. Encounter to establish care   Follow-up: Return if symptoms worsen or fail to improve.   Alex Limes, MSN, APRN, FNP-C [facility]  Subjective:  Patient ID: Alex Oneill, male    DOB: [birth year]  Age: 23 y.o. MRN: [id number]  Patient Care Team: Loman Brooklyn, FNP as PCP - General (Family Medicine)  CC:  Chief Complaint  Patient presents with   New Patient (Initial Visit)    back pain    HPI Alex Oneill presents to establish care. He is transferring care from Dr. Murrell Redden office as he has retired and the office has closed. Patient also c/o back pain.  Back Pain: Patient presents for presents evaluation of low back problems.  Symptoms have been present for several years and include pain in low back with radiation down both legs (burning and sharp in character; 7/10 in severity). Initial inciting event: patient feels this resulted from playing sports in high school. Symptoms are not worse any particular time of day. Alleviating factors identifiable by patient are heat, ice, NSAIDs, and muscle relaxers. Exacerbating factors identifiable by patient are bending backwards, bending forwards, bending sideways and sitting. Previous workup: x-rays and MRI. Previous treatments: an injection  and physical therapy which were effective. Patient quit going because he lost his insurance and could not afford it. He was previously going to [employer].   Asthma: uses Albuterol inhaler less than twice a week.    Review of Systems  Constitutional: Negative for chills, fever, malaise/fatigue and weight loss.  HENT: Negative for congestion, ear discharge, ear pain, nosebleeds, sinus pain, sore throat and tinnitus.   Eyes: Negative for blurred vision, double vision, pain, discharge and redness.  Respiratory: Negative for cough, shortness of breath and wheezing.   Cardiovascular: Negative for chest pain, palpitations and leg swelling.  Gastrointestinal: Negative for abdominal pain, constipation, diarrhea, heartburn, nausea and vomiting.  Genitourinary: Negative for dysuria, frequency and urgency.  Musculoskeletal: Positive for back pain. Negative for myalgias.  Skin: Negative for rash.  Neurological: Negative for dizziness, seizures, weakness and headaches.  Psychiatric/Behavioral: Negative for depression, substance abuse and suicidal ideas. The patient is not nervous/anxious.      Current Outpatient Medications:    albuterol (VENTOLIN HFA) 108 (90 Base) MCG/ACT inhaler, Inhale 1-2 puffs into the lungs every 6 (six) hours as needed for wheezing or shortness of breath., Disp: , Rfl:    diclofenac (VOLTAREN) 75 MG EC tablet, Take 1 tablet (75 mg total) by mouth 2 (two) times daily., Disp: 12 tablet, Rfl: 0   Tdap (BOOSTRIX) 5-2.5-18.5 LF-MCG/0.5 injection, Inject 0.5 mLs into the muscle once for 1 dose., Disp: 0.5 mL, Rfl: 0  Allergies  Allergen Reactions   Penicillins     Did it involve swelling of the face/tongue/throat, SOB, or low BP? No Did  it involve sudden or severe rash/hives, skin peeling, or any reaction on the inside of your mouth or nose? No Did you need to seek medical attention at a hospital or doctor's office? No When did it last happen? If all above answers  are NO, may proceed with cephalosporin use.     Past Medical History:  Diagnosis Date   Asthma    Heart murmur     Past Surgical History:  Procedure Laterality Date   KNEE ARTHROPLASTY Left    TONSILLECTOMY      Family History  Problem Relation Age of Onset   Breast cancer Mother     Social History   Socioeconomic History   Marital status: Single    Spouse name: Not on file   Number of children: Not on file   Years of education: Not on file   Highest education level: Not on file  Occupational History   Not on file  Social Needs   Financial resource strain: Not on file   Food insecurity    Worry: Not on file    Inability: Not on file   Transportation needs    Medical: Not on file    Non-medical: Not on file  Tobacco Use   Smoking status: Never Smoker   Smokeless tobacco: Never Used  Substance and Sexual Activity   Alcohol use: No   Drug use: No   Sexual activity: Yes  Lifestyle   Physical activity    Days per week: Not on file    Minutes per session: Not on file   Stress: Not on file  Relationships   Social connections    Talks on phone: Not on file    Gets together: Not on file    Attends religious service: Not on file    Active member of club or organization: Not on file    Attends meetings of clubs or organizations: Not on file    Relationship status: Not on file   Intimate partner violence    Fear of current or ex partner: Not on file    Emotionally abused: Not on file    Physically abused: Not on file    Forced sexual activity: Not on file  Other Topics Concern   Not on file  Social History Narrative   Not on file    Objective:   Today's Vitals: BP 125/85    Pulse 68    Temp 98.9 F (37.2 C) (Oral)    Ht 5\' 8"  (1.727 m)    Wt 171 lb 12.8 oz (77.9 kg)    BMI 26.12 kg/m   Physical Exam Vitals signs reviewed.  Constitutional:      General: He is not in acute distress.    Appearance: Normal appearance. He is  overweight. He is not ill-appearing, toxic-appearing or diaphoretic.  HENT:     Head: Normocephalic and atraumatic.  Eyes:     General: No scleral icterus.       Right eye: No discharge.        Left eye: No discharge.     Conjunctiva/sclera: Conjunctivae normal.  Neck:     Musculoskeletal: Normal range of motion.  Cardiovascular:     Rate and Rhythm: Normal rate and regular rhythm.     Heart sounds: Normal heart sounds. No murmur. No friction rub. No gallop.   Pulmonary:     Effort: Pulmonary effort is normal. No respiratory distress.     Breath sounds: Normal breath sounds. No stridor. No  wheezing, rhonchi or rales.  Musculoskeletal: Normal range of motion.     Thoracic back: He exhibits tenderness and bony tenderness. He exhibits normal range of motion, no swelling, no edema, no deformity, no laceration, no pain, no spasm and normal pulse.     Lumbar back: He exhibits normal range of motion, no tenderness, no bony tenderness, no swelling, no edema, no deformity, no laceration, no pain, no spasm and normal pulse.  Skin:    General: Skin is warm and dry.  Neurological:     Mental Status: He is alert and oriented to person, place, and time. Mental status is at baseline.  Psychiatric:        Mood and Affect: Mood normal.        Behavior: Behavior normal.        Thought Content: Thought content normal.        Judgment: Judgment normal.

## 2018-09-17 NOTE — Patient Instructions (Addendum)
Ibuprofen (2-3 tablets) 400-600 mg with Tylenol 500 mg every 6-8 hours as needed for pain.   Chronic Back Pain When back pain lasts longer than 3 months, it is called chronic back pain. Pain may get worse at certain times (flare-ups). There are things you can do at home to manage your pain. Follow these instructions at home: Activity      Avoid bending and other activities that make pain worse.  When standing: ? Keep your upper back and neck straight. ? Keep your shoulders pulled back. ? Avoid slouching.  When sitting: ? Keep your back straight. ? Relax your shoulders. Do not round your shoulders or pull them backward.  Do not sit or stand in one place for long periods of time.  Take short rest breaks during the day. Lying down or standing is usually better than sitting. Resting can help relieve pain.  When sitting or lying down for a long time, do some mild activity or stretching. This will help to prevent stiffness and pain.  Get regular exercise. Ask your doctor what activities are safe for you.  Do not lift anything that is heavier than 10 lb (4.5 kg). To prevent injury when you lift things: ? Bend your knees. ? Keep the weight close to your body. ? Avoid twisting. Managing pain  If told, put ice on the painful area. Your doctor may tell you to use ice for 24-48 hours after a flare-up starts. ? Put ice in a plastic bag. ? Place a towel between your skin and the bag. ? Leave the ice on for 20 minutes, 2-3 times a day.  If told, put heat on the painful area as often as told by your doctor. Use the heat source that your doctor recommends, such as a moist heat pack or a heating pad. ? Place a towel between your skin and the heat source. ? Leave the heat on for 20-30 minutes. ? Remove the heat if your skin turns bright red. This is especially important if you are unable to feel pain, heat, or cold. You may have a greater risk of getting burned.  Soak in a warm bath. This  can help relieve pain.  Take over-the-counter and prescription medicines only as told by your doctor. General instructions  Sleep on a firm mattress. Try lying on your side with your knees slightly bent. If you lie on your back, put a pillow under your knees.  Keep all follow-up visits as told by your doctor. This is important. Contact a doctor if:  You have pain that does not get better with rest or medicine. Get help right away if:  One or both of your arms or legs feel weak.  One or both of your arms or legs lose feeling (numbness).  You have trouble controlling when you poop (bowel movement) or pee (urinate).  You feel sick to your stomach (nauseous).  You throw up (vomit).  You have belly (abdominal) pain.  You have shortness of breath.  You pass out (faint). Summary  When back pain lasts longer than 3 months, it is called chronic back pain.  Pain may get worse at certain times (flare-ups).  Use ice and heat as told by your doctor. Your doctor may tell you to use ice after flare-ups. This information is not intended to replace advice given to you by your health care provider. Make sure you discuss any questions you have with your health care provider. Document Released: 06/29/2007 Document Revised: 05/03/2018  Document Reviewed: 08/25/2016 Elsevier Patient Education  [place].   Back Exercises These exercises help to make your trunk and back strong. They also help to keep the lower back flexible. Doing these exercises can help to prevent back pain or lessen existing pain.  If you have back pain, try to do these exercises 2-3 times each day or as told by your doctor.  As you get better, do the exercises once each day. Repeat the exercises more often as told by your doctor.  To stop back pain from coming back, do the exercises once each day, or as told by your doctor. Exercises Single knee to chest Do these steps 3-5 times in a row for each leg: 1. Lie  on your back on a firm bed or the floor with your legs stretched out. 2. Bring one knee to your chest. 3. Grab your knee or thigh with both hands and hold them it in place. 4. Pull on your knee until you feel a gentle stretch in your lower back or buttocks. 5. Keep doing the stretch for 10-30 seconds. 6. Slowly let go of your leg and straighten it. Pelvic tilt Do these steps 5-10 times in a row: 1. Lie on your back on a firm bed or the floor with your legs stretched out. 2. Bend your knees so they point up to the ceiling. Your feet should be flat on the floor. 3. Tighten your lower belly (abdomen) muscles to press your lower back against the floor. This will make your tailbone point up to the ceiling instead of pointing down to your feet or the floor. 4. Stay in this position for 5-10 seconds while you gently tighten your muscles and breathe evenly. Cat-cow Do these steps until your lower back bends more easily: 1. Get on your hands and knees on a firm surface. Keep your hands under your shoulders, and keep your knees under your hips. You may put padding under your knees. 2. Let your head hang down toward your chest. Tighten (contract) the muscles in your belly. Point your tailbone toward the floor so your lower back becomes rounded like the back of a cat. 3. Stay in this position for 5 seconds. 4. Slowly lift your head. Let the muscles of your belly relax. Point your tailbone up toward the ceiling so your back forms a sagging arch like the back of a cow. 5. Stay in this position for 5 seconds.  Press-ups Do these steps 5-10 times in a row: 1. Lie on your belly (face-down) on the floor. 2. Place your hands near your head, about shoulder-width apart. 3. While you keep your back relaxed and keep your hips on the floor, slowly straighten your arms to raise the top half of your body and lift your shoulders. Do not use your back muscles. You may change where you place your hands in order to make  yourself more comfortable. 4. Stay in this position for 5 seconds. 5. Slowly return to lying flat on the floor.  Bridges Do these steps 10 times in a row: 1. Lie on your back on a firm surface. 2. Bend your knees so they point up to the ceiling. Your feet should be flat on the floor. Your arms should be flat at your sides, next to your body. 3. Tighten your butt muscles and lift your butt off the floor until your waist is almost as high as your knees. If you do not feel the muscles working in  your butt and the back of your thighs, slide your feet 1-2 inches farther away from your butt. 4. Stay in this position for 3-5 seconds. 5. Slowly lower your butt to the floor, and let your butt muscles relax. If this exercise is too easy, try doing it with your arms crossed over your chest. Belly crunches Do these steps 5-10 times in a row: 1. Lie on your back on a firm bed or the floor with your legs stretched out. 2. Bend your knees so they point up to the ceiling. Your feet should be flat on the floor. 3. Cross your arms over your chest. 4. Tip your chin a little bit toward your chest but do not bend your neck. 5. Tighten your belly muscles and slowly raise your chest just enough to lift your shoulder blades a tiny bit off of the floor. Avoid raising your body higher than that, because it can put too much stress on your low back. 6. Slowly lower your chest and your head to the floor. Back lifts Do these steps 5-10 times in a row: 1. Lie on your belly (face-down) with your arms at your sides, and rest your forehead on the floor. 2. Tighten the muscles in your legs and your butt. 3. Slowly lift your chest off of the floor while you keep your hips on the floor. Keep the back of your head in line with the curve in your back. Look at the floor while you do this. 4. Stay in this position for 3-5 seconds. 5. Slowly lower your chest and your face to the floor. Contact a doctor if:  Your back pain gets a  lot worse when you do an exercise.  Your back pain does not get better 2 hours after you exercise. If you have any of these problems, stop doing the exercises. Do not do them again unless your doctor says it is okay. Get help right away if:  You have sudden, very bad back pain. If this happens, stop doing the exercises. Do not do them again unless your doctor says it is okay. This information is not intended to replace advice given to you by your health care provider. Make sure you discuss any questions you have with your health care provider. Document Released: 02/12/2010 Document Revised: 10/05/2017 Document Reviewed: 10/05/2017 Elsevier Patient Education  2020 [employer]

## 2018-09-25 ENCOUNTER — Ambulatory Visit: Payer: BC Managed Care – PPO | Attending: Family Medicine | Admitting: Physical Therapy

## 2018-10-21 ENCOUNTER — Emergency Department (HOSPITAL_COMMUNITY)
Admission: EM | Admit: 2018-10-21 | Discharge: 2018-10-21 | Disposition: A | Discharge status: Home / Self Care | Payer: BC Managed Care – PPO | Attending: Emergency Medicine | Admitting: Emergency Medicine

## 2018-10-21 ENCOUNTER — Encounter (HOSPITAL_COMMUNITY): Payer: Self-pay | Admitting: Emergency Medicine

## 2018-10-21 ENCOUNTER — Other Ambulatory Visit: Payer: Self-pay

## 2018-10-21 DIAGNOSIS — Z79899 Other long term (current) drug therapy: Secondary | ICD-10-CM | POA: Diagnosis not present

## 2018-10-21 DIAGNOSIS — Y999 Unspecified external cause status: Secondary | ICD-10-CM | POA: Insufficient documentation

## 2018-10-21 DIAGNOSIS — J45909 Unspecified asthma, uncomplicated: Secondary | ICD-10-CM | POA: Diagnosis not present

## 2018-10-21 DIAGNOSIS — S43101A Unspecified dislocation of right acromioclavicular joint, initial encounter: Secondary | ICD-10-CM | POA: Diagnosis not present

## 2018-10-21 DIAGNOSIS — S4991XA Unspecified injury of right shoulder and upper arm, initial encounter: Secondary | ICD-10-CM | POA: Diagnosis present

## 2018-10-21 DIAGNOSIS — Z96652 Presence of left artificial knee joint: Secondary | ICD-10-CM | POA: Diagnosis not present

## 2018-10-21 DIAGNOSIS — W010XXA Fall on same level from slipping, tripping and stumbling without subsequent striking against object, initial encounter: Secondary | ICD-10-CM | POA: Diagnosis not present

## 2018-10-21 DIAGNOSIS — Y9361 Activity, american tackle football: Secondary | ICD-10-CM | POA: Insufficient documentation

## 2018-10-21 DIAGNOSIS — Y929 Unspecified place or not applicable: Secondary | ICD-10-CM | POA: Diagnosis not present

## 2018-10-21 MED ORDER — IBUPROFEN 800 MG PO TABS: 800.0000 mg | ORAL_TABLET | Freq: Three times a day (TID) | ORAL | 0 refills | Status: DC

## 2018-10-21 NOTE — ED Notes (Signed)
Pt sent here from [facility] with a rt shoulder injury  He was playing football and fell onto his rt shoulder.  He was seen at [facility] in [place] was given pain med and sent here

## 2018-10-21 NOTE — ED Triage Notes (Signed)
Pt arrives from Urgent Care with a dislocated shoulder confirmed by xray.

## 2018-10-21 NOTE — ED Provider Notes (Signed)
Alex Oneill [facility] EMERGENCY DEPARTMENT Provider Note   CSN: [id number] Arrival date & time: 10/21/18  1442     History   Chief Complaint Chief Complaint  Patient presents with  . Shoulder Injury    HPI Alex Oneill is a 23 y.o. male with a past medical history of asthma presents to ED for right shoulder injury.  States that he was playing football, tried to jump to catch a pass.  He then landed on his right shoulder.  States that he felt a pop and had immediate pain.  States that he had trouble moving his arm immediately afterwards.  He presented to the urgent care, x-ray was completed which showed AC joint separation.  However he was told that he had dislocated his shoulder and needed to come to the ED.  He denies history of dislocation in the past.  He denies any other injuries, denies head injury, loss of consciousness, numbness in arms or legs.  He was given IM Toradol with significant improvement in his pain.     HPI  Past Medical History:  Diagnosis Date  . Asthma   . Heart murmur     Patient Active Problem List   Diagnosis Date Noted  . Asthma     Past Surgical History:  Procedure Laterality Date  . KNEE ARTHROPLASTY Left   . TONSILLECTOMY          Home Medications    Prior to Admission medications   Medication Sig Start Date End Date Taking? Authorizing Provider  albuterol (VENTOLIN HFA) 108 (90 Base) MCG/ACT inhaler Inhale 1-2 puffs into the lungs every 6 (six) hours as needed for wheezing or shortness of breath.    [provider]  diclofenac (VOLTAREN) 75 MG EC tablet Take 1 tablet (75 mg total) by mouth 2 (two) times daily. 07/27/18   Ivery Quale, PA-C  ibuprofen (ADVIL) 800 MG tablet Take 1 tablet (800 mg total) by mouth 3 (three) times daily. 10/21/18   Dietrich Pates, PA-C    Family History Family History  Problem Relation Age of Onset  . Breast cancer Mother     Social History Social History   Tobacco Use  . Smoking  status: Never Smoker  . Smokeless tobacco: Never Used  Substance Use Topics  . Alcohol use: No  . Drug use: No     Allergies   Penicillins   Review of Systems Review of Systems  Constitutional: Negative for appetite change, chills and fever.  HENT: Negative for ear pain, rhinorrhea, sneezing and sore throat.   Eyes: Negative for photophobia and visual disturbance.  Respiratory: Negative for cough, chest tightness, shortness of breath and wheezing.   Cardiovascular: Negative for chest pain and palpitations.  Gastrointestinal: Negative for abdominal pain, blood in stool, constipation, diarrhea, nausea and vomiting.  Genitourinary: Negative for dysuria, hematuria and urgency.  Musculoskeletal: Positive for arthralgias. Negative for myalgias.  Skin: Negative for rash.  Neurological: Negative for dizziness, weakness and light-headedness.     Physical Exam Updated Vital Signs BP 134/87 (BP Location: Left Arm)   Pulse 98   Temp 98.9 F (37.2 C) (Oral)   Resp 18   Ht 5\' 9"  (1.753 m)   Wt 77.1 kg   SpO2 100%   BMI 25.10 kg/m   Physical Exam Vitals signs and nursing note reviewed.  Constitutional:      General: He is not in acute distress.    Appearance: He is well-developed.  HENT:  Head: Normocephalic and atraumatic.     Nose: Nose normal.  Eyes:     General: No scleral icterus.       Right eye: No discharge.        Left eye: No discharge.     Conjunctiva/sclera: Conjunctivae normal.  Neck:     Musculoskeletal: Normal range of motion and neck supple.  Cardiovascular:     Rate and Rhythm: Normal rate and regular rhythm.     Heart sounds: Normal heart sounds. No murmur. No friction rub. No gallop.   Pulmonary:     Effort: Pulmonary effort is normal. No respiratory distress.     Breath sounds: Normal breath sounds.  Abdominal:     General: Bowel sounds are normal. There is no distension.     Palpations: Abdomen is soft.     Tenderness: There is no abdominal  tenderness. There is no guarding.  Musculoskeletal: Normal range of motion.        General: Tenderness present.     Comments: TTP of R shoulder. Pain with ROM of shoulder although able to perform.  Sensation intact to light touch of bilateral upper extremities.  2+ radial pulse palpated bilaterally. FROM of wrist and elbow. No overlying skin changes noted.  Skin:    General: Skin is warm and dry.     Findings: No rash.  Neurological:     Mental Status: He is alert.     Motor: No abnormal muscle tone.     Coordination: Coordination normal.      ED Treatments / Results  Labs (all labs ordered are listed, but only abnormal results are displayed) Labs Reviewed - No data to display  EKG None  Radiology No results found.  Procedures Procedures (including critical care time)  Medications Ordered in ED Medications - No data to display   Initial Impression / Assessment and Plan / ED Course  I have reviewed the triage vital signs and the nursing notes.  Pertinent labs & imaging results that were available during my care of the patient were reviewed by me and considered in my medical decision making (see chart for details).        23 year old male presents to ED for right shoulder injury while playing football.  He was seen in urgent care, x-ray was done which showed AC joint separation.  He was told that his shoulder was dislocated and he needed to come to the ED for reduction. Unsure why he was told this, as the urgent care report notes the xray report which states AC joint separation. I viewed xrays independently and agreed with interpretation. Patient with pain with ROM but no overlying skin changes noted. Area is NVI. Patient given sling here, advised to take Tylenol ibuprofen and orthopedic follow-up.  Patient agreeable to plan.  Will advise him to return for any worsening symptoms.  Patient is hemodynamically stable, in NAD, and able to ambulate in the ED. Evaluation does not  show pathology that would require ongoing emergent intervention or inpatient treatment. I explained the diagnosis to the patient. Pain has been managed and has no complaints prior to discharge. Patient is comfortable with above plan and is stable for discharge at this time. All questions were answered prior to disposition. Strict return precautions for returning to the ED were discussed. Encouraged follow up with PCP.   An After Visit Summary was printed and given to the patient.   Portions of this note were generated with [employer]. Dictation errors  may occur despite best attempts at proofreading.   Final Clinical Impressions(s) / ED Diagnoses   Final diagnoses:  Separation of right acromioclavicular joint, initial encounter    ED Discharge Orders         Ordered    ibuprofen (ADVIL) 800 MG tablet  3 times daily     10/21/18 1604           Dietrich PatesKhatri, Bren Steers, PA-C 10/21/18 1617    Charlynne PanderYao, David Hsienta, MD 10/21/18 2150

## 2018-10-21 NOTE — Discharge Instructions (Signed)
You will need to alternate ibuprofen and Tylenol to help with your pain. Read the follow instructions regarding AC joint separation. Follow-up with the orthopedist listed below as well as your PCP. Return to the ED if you start to have worsening pain, additional injuries or falls, numbness in arms.

## 2018-11-13 ENCOUNTER — Other Ambulatory Visit: Payer: Self-pay | Admitting: *Deleted

## 2018-11-13 DIAGNOSIS — Z20822 Contact with and (suspected) exposure to covid-19: Secondary | ICD-10-CM

## 2018-11-14 LAB — NOVEL CORONAVIRUS, NAA: SARS-CoV-2, NAA: NOT DETECTED

## 2018-11-29 ENCOUNTER — Ambulatory Visit (INDEPENDENT_AMBULATORY_CARE_PROVIDER_SITE_OTHER): Payer: BC Managed Care – PPO | Admitting: Family Medicine

## 2018-11-29 DIAGNOSIS — Z91199 Patient's noncompliance with other medical treatment and regimen due to unspecified reason: Secondary | ICD-10-CM

## 2018-11-29 DIAGNOSIS — Z5329 Procedure and treatment not carried out because of patient's decision for other reasons: Secondary | ICD-10-CM

## 2018-11-29 NOTE — Progress Notes (Signed)
Attempted to call x 4, no answer.

## 2019-01-02 ENCOUNTER — Other Ambulatory Visit: Payer: Self-pay

## 2019-01-02 ENCOUNTER — Ambulatory Visit: Payer: BC Managed Care – PPO | Admitting: Family Medicine

## 2019-01-03 ENCOUNTER — Ambulatory Visit (INDEPENDENT_AMBULATORY_CARE_PROVIDER_SITE_OTHER): Payer: BC Managed Care – PPO

## 2019-01-03 ENCOUNTER — Other Ambulatory Visit: Payer: Self-pay

## 2019-01-03 ENCOUNTER — Ambulatory Visit: Payer: BC Managed Care – PPO | Admitting: Family Medicine

## 2019-01-03 ENCOUNTER — Encounter: Payer: Self-pay | Admitting: Family Medicine

## 2019-01-03 VITALS — BP 131/87 | HR 76 | Temp 98.4°F | Resp 20 | Ht 69.0 in | Wt 179.0 lb

## 2019-01-03 DIAGNOSIS — G8929 Other chronic pain: Secondary | ICD-10-CM | POA: Diagnosis not present

## 2019-01-03 DIAGNOSIS — M25511 Pain in right shoulder: Secondary | ICD-10-CM

## 2019-01-03 DIAGNOSIS — S43101D Unspecified dislocation of right acromioclavicular joint, subsequent encounter: Secondary | ICD-10-CM | POA: Diagnosis not present

## 2019-01-03 MED ORDER — NAPROXEN 500 MG PO TABS: 500.0000 mg | ORAL_TABLET | Freq: Two times a day (BID) | ORAL | 1 refills | Status: AC

## 2019-01-03 NOTE — Progress Notes (Signed)
Pt aware during visit.   

## 2019-01-03 NOTE — Progress Notes (Signed)
Alex Oneill is a 23 y.o. male  Shoulder Pain: Patient complaints of right shoulder pain. This is evaluated as a personal injury. The pain is described as aching and dull.  The onset of the pain was sudden, related to an interscholastic sport: football. Mechanism of injury: blow.  The pain occurs every day and lasts 10 minutes.  Location is anterior, lateral, acromioclavicular joint. suspection of dislocation at time of injury on 10/21/2018. Symptoms are aggravated by reaching, lifting, pulling, work at or above shoulder height. Symptoms are diminisohed by  medication: Motrin used and beneficial.   Limited activities include: lifting, pulling, repetitive use, work at or above shoulder height. mild stiffness, no weakness, no swelling, no crepitus noted is reported   Review of Systems  Constitutional: Negative for chills, diaphoresis, fever, malaise/fatigue and weight loss.  Respiratory: Negative for shortness of breath.   Cardiovascular: Negative for chest pain and palpitations.  Musculoskeletal: Positive for joint pain and myalgias. Negative for back pain, falls and neck pain.  Skin: Negative for rash.  All other systems reviewed and are negative.  .   Past Medical History:  Diagnosis Date  . Asthma   . Heart murmur    Past Surgical History:  Procedure Laterality Date  . KNEE ARTHROPLASTY Left   . TONSILLECTOMY      Current Outpatient Medications:  .  albuterol (VENTOLIN HFA) 108 (90 Base) MCG/ACT inhaler, Inhale 1-2 puffs into the lungs every 6 (six) hours as needed for wheezing or shortness of breath., Disp: , Rfl:  .  naproxen (NAPROSYN) 500 MG tablet, Take 1 tablet (500 mg total) by mouth 2 (two) times daily with a meal., Disp: 60 tablet, Rfl: 1 Allergies  Allergen Reactions  . Penicillins     Did it involve swelling of the face/tongue/throat, SOB, or low BP? No Did it involve sudden or severe rash/hives, skin peeling, or any reaction on the inside of your mouth or nose?  No Did you need to seek medical attention at a hospital or doctor's office? No When did it last happen? If all above answers are "NO", may proceed with cephalosporin use.     reports that he has never smoked. He has never used smokeless tobacco. He reports that he does not drink alcohol or use drugs. Family History  Problem Relation Age of Onset  . Breast cancer Mother    Physical Exam  Constitutional: He is oriented to person, place, and time and well-developed, well-nourished, and in no distress. No distress.  HENT:  Head: Normocephalic.  Eyes: Pupils are equal, round, and reactive to light. Conjunctivae are normal.  Cardiovascular: Normal rate, regular rhythm and intact distal pulses. Exam reveals friction rub. Exam reveals no gallop and no distant heart sounds.  No murmur heard. Pulmonary/Chest: Effort normal and breath sounds normal.  Abdominal: Soft. Bowel sounds are normal.  Musculoskeletal:     Right shoulder: Deformity, tenderness and pain present. No swelling, effusion, laceration, bony tenderness, crepitus or spasms. Normal range of motion. Normal strength. Normal pulse.     Left shoulder: Normal.     Right elbow: Normal.     Cervical back: Normal range of motion and neck supple. Normal.  Neurological: He is alert and oriented to person, place, and time. Gait normal.  Skin: Skin is warm and dry. He is not diaphoretic.  Psychiatric: Memory, affect and judgment normal.    Right Shoulder: Inspection reveals asymmetry.   Palpation with slight tenderness over right AC joint. ROM  is full in all planes.  No signs of impingement with negative Neer and Hawkin's tests, empty can. Normal scapular function observed. No painful arc and no drop arm sign. No apprehension sign.   X-Ray: right shoulder: AC joint separation, no fractures or acute dislocation. Preliminary x-ray reading by Monia Pouch, FNP-C, [facility].   Assessment and Plan  Arnulfo was seen today for  shoulder pain.  Diagnoses and all orders for this visit:  Chronic right shoulder pain Separation of right acromioclavicular joint, subsequent encounter Continued pain in right shoulder joint despite conservative therapy. AC joint separation noted on plain films today, will order MRI and refer to orhto as pt is not happy with current ortho care. Will trial naproxen twice daily as needed for pain. Report any new or worsening symptoms.  -     DG Shoulder Right; Future -     naproxen (NAPROSYN) 500 MG tablet; Take 1 tablet (500 mg total) by mouth 2 (two) times daily with a meal. -     MR Shoulder Right Wo Contrast; Future -     Ambulatory referral to Orthopedic Surgery    Return in about 4 weeks (around 01/31/2019), or if symptoms worsen or fail to improve.  The above assessment and management plan was discussed with the patient. The patient verbalized understanding of and has agreed to the management plan. Patient is aware to call the clinic if they develop any new symptoms or if symptoms fail to improve or worsen. Patient is aware when to return to the clinic for a follow-up visit. Patient educated on when it is appropriate to go to the emergency department.   Monia Pouch, FNP-C [facility] [address] [place], [place] [address] [phone]

## 2019-01-03 NOTE — Patient Instructions (Signed)
Surgery for Acromioclavicular Separation, Care After This sheet gives you information about how to care for yourself after your procedure. Your health care provider may also give you more specific instructions. If you have problems or questions, contact your health care provider. What can I expect after the procedure? After the procedure, it is common to have:  Pain.  Soreness.  Stiffness.  Tenderness. Follow these instructions at home: If you have a sling:  Wear the sling as told by your health care provider. Remove it only as told by your health care provider.  Keep the sling clean.  Do not let your sling get wet. Bathing  Do not take baths, swim, or use a hot tub until your health care provider approves. Ask your health care provider if you may take showers. You may only be allowed to take sponge baths.  Keep your bandage (dressing) dry until your health care provider says it can be removed. Incision care   Follow instructions from your health care provider about how to take care of your incision or incisions. Make sure you: ? Wash your hands with soap and water before and after you change your bandage (dressing). If soap and water are not available, use hand sanitizer. ? Change your dressing as told by your health care provider. ? Leave stitches (sutures), skin glue, or adhesive strips in place. These skin closures may need to stay in place for 2 weeks or longer. If adhesive strip edges start to loosen and curl up, you may trim the loose edges. Do not remove adhesive strips completely unless your health care provider tells you to do that.  Check your incision areas every day for signs of infection. Check for: ? More redness, swelling, or pain. ? Fluid or blood. ? Warmth. ? Pus or a bad smell. Managing pain, stiffness, and swelling   If directed, put ice on your shoulder area. ? Put ice in a plastic bag. ? Place a towel between your skin and the bag. ? Leave the ice on  for 20 minutes, 2-3 times a day.  Move your fingers often to reduce stiffness and swelling.  Raise (elevate) the injured area above the level of your heart while you are sitting or lying down. Driving  Do not drive for 24 hours if you were given a sedative during your procedure.  Ask your health care provider when it is safe for you to drive. Activity  Do not use your arm to support your body weight until your health care provider approves.  Do not lift your arm at the shoulder with your muscles until told by your health care provider.  Do not lift anything that is heavier than 10 lb (4.5 kg), or the limit that you are told, until your health care provider says that it is safe.  Return to your normal activities as told by your health care provider. Ask your health care provider what activities are safe for you.  Do exercises only as told by your health care provider.  Move your fingers, hands, and elbow to increase blood flow as told by your health care provider. Medicines  Take over-the-counter and prescription medicines only as told by your health care provider.  Ask your health care provider if the medicine prescribed to you: ? Requires you to avoid driving or using heavy machinery. ? Can cause constipation. You may need to take steps to prevent or treat constipation, such as:  Drink enough fluid to keep your urine pale yellow.  Take over-the-counter or prescription medicines.  Eat foods that are high in fiber, such as beans, whole grains, and fresh fruits and vegetables.  Limit foods that are high in fat and processed sugars, such as fried or sweet foods. General instructions   Do not use any products that contain nicotine or tobacco, such as cigarettes, e-cigarettes, and chewing tobacco. These can delay healing after surgery. If you need help quitting, ask your health care provider.  Keep all follow-up visits as told by your health care provider. This is important.  Contact a health care provider if:  You continue to have pain and stiffness after 6 weeks.  You cannot do your physical therapy exercises.  You have any of these problems: ? A fever. ? More redness, swelling, or pain around your incision. ? Fluid or blood coming from your incision. ? Your incision feeling warm to the touch. ? Pus or a bad smell coming from your incision. ? Pain that gets worse or does not get better with medicine. Get help right away if:  Your arm becomes numb.  Your arm turns cold and blue. Summary  If you have a sling, wear it as told by your health care provider. Remove it only as told by your health care provider.  Change your bandage (dressing) as told by your health care provider. Check your incision areas every day for signs of infection.  Return to your normal activities as told by your health care provider. Ask your health care provider what activities are safe for you.  Keep all follow-up visits as told by your health care provider. This is important. This information is not intended to replace advice given to you by your health care provider. Make sure you discuss any questions you have with your health care provider. Document Released: 08/11/2004 Document Revised: 12/25/2017 Document Reviewed: 12/25/2017 Elsevier Patient Education  2020 [employer].

## 2019-01-11 ENCOUNTER — Ambulatory Visit (HOSPITAL_COMMUNITY): Admission: RE | Admit: 2019-01-11 | Payer: BC Managed Care – PPO | Source: Ambulatory Visit

## 2020-04-16 IMAGING — CR PORTABLE CHEST - 1 VIEW
1 series · 2 of 2 positions shown · non-contrast
Comparison: Chest x-ray dated October 17, 2017.

CLINICAL DATA: Recurrent chest tightness and mild shortness of
breath.

EXAM:
PORTABLE CHEST 1 VIEW

[Series 1: portable · 0.17mm/px · 2 of 2 slices shown]
[im 1/2]
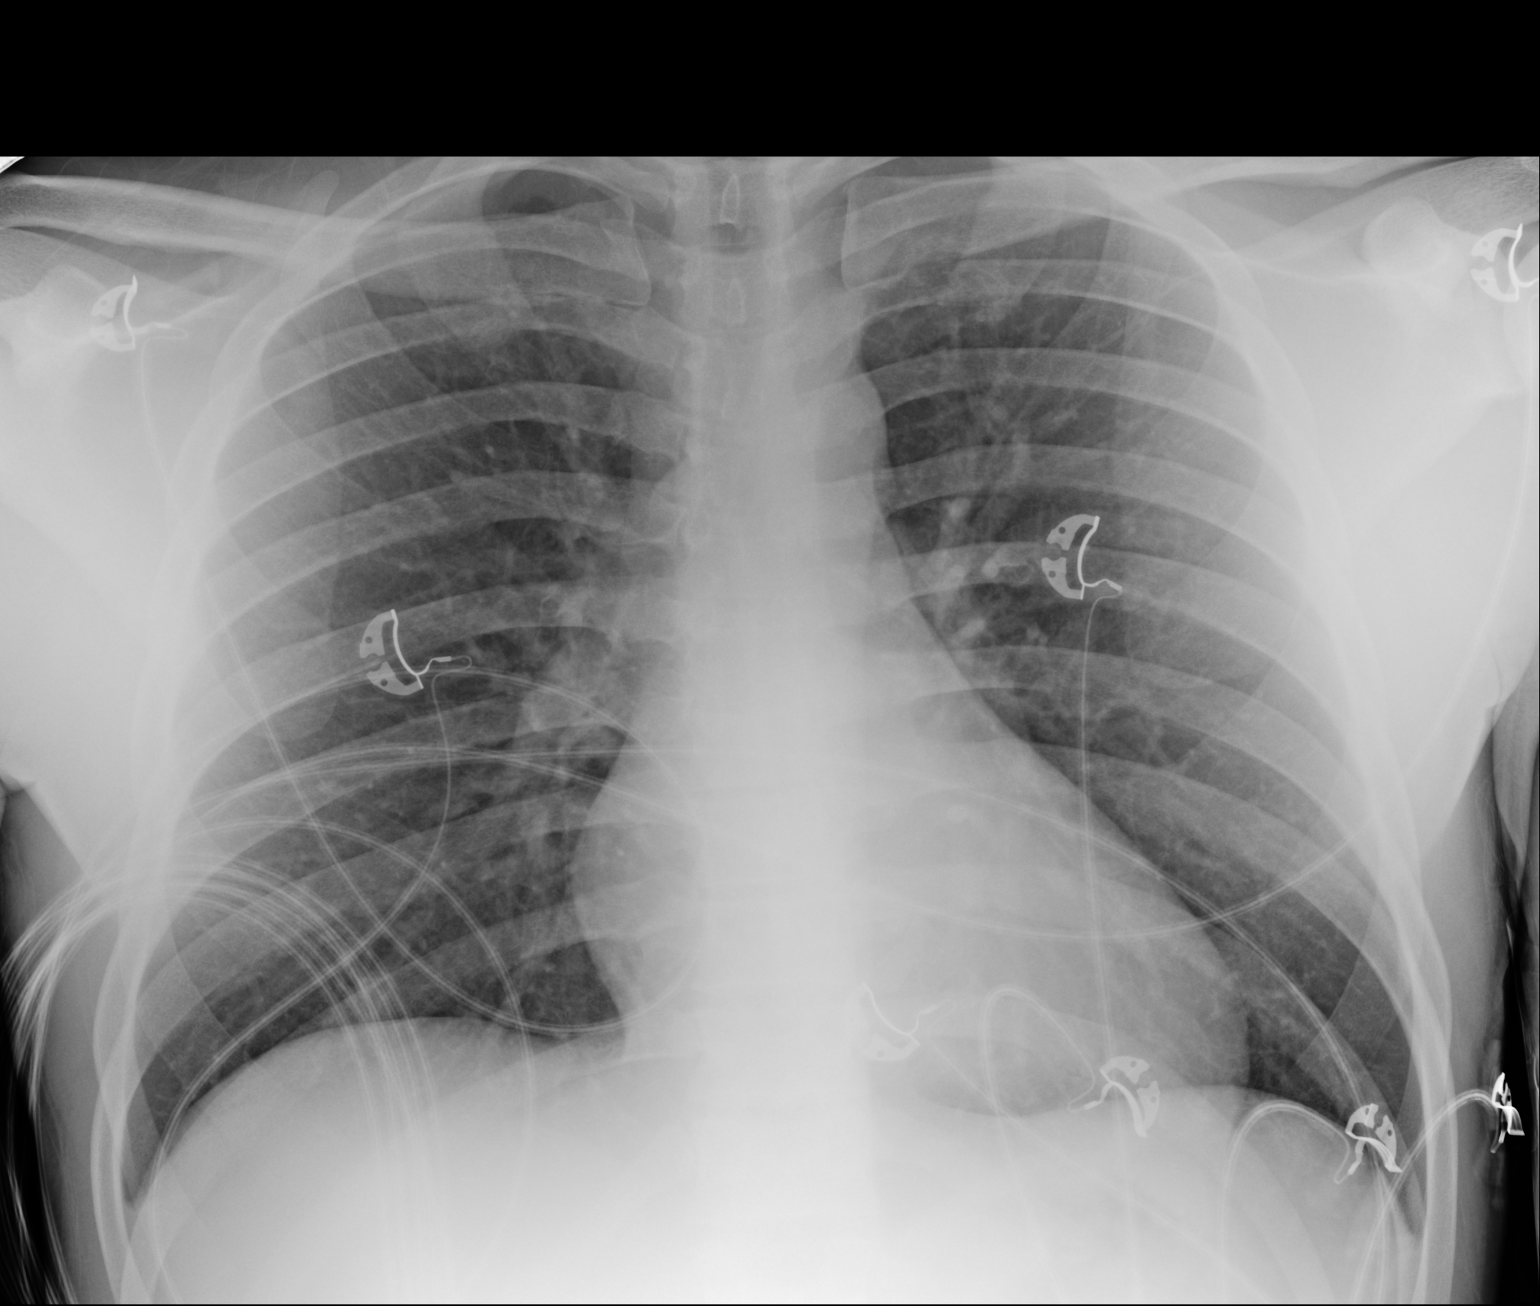
[im 2/2]
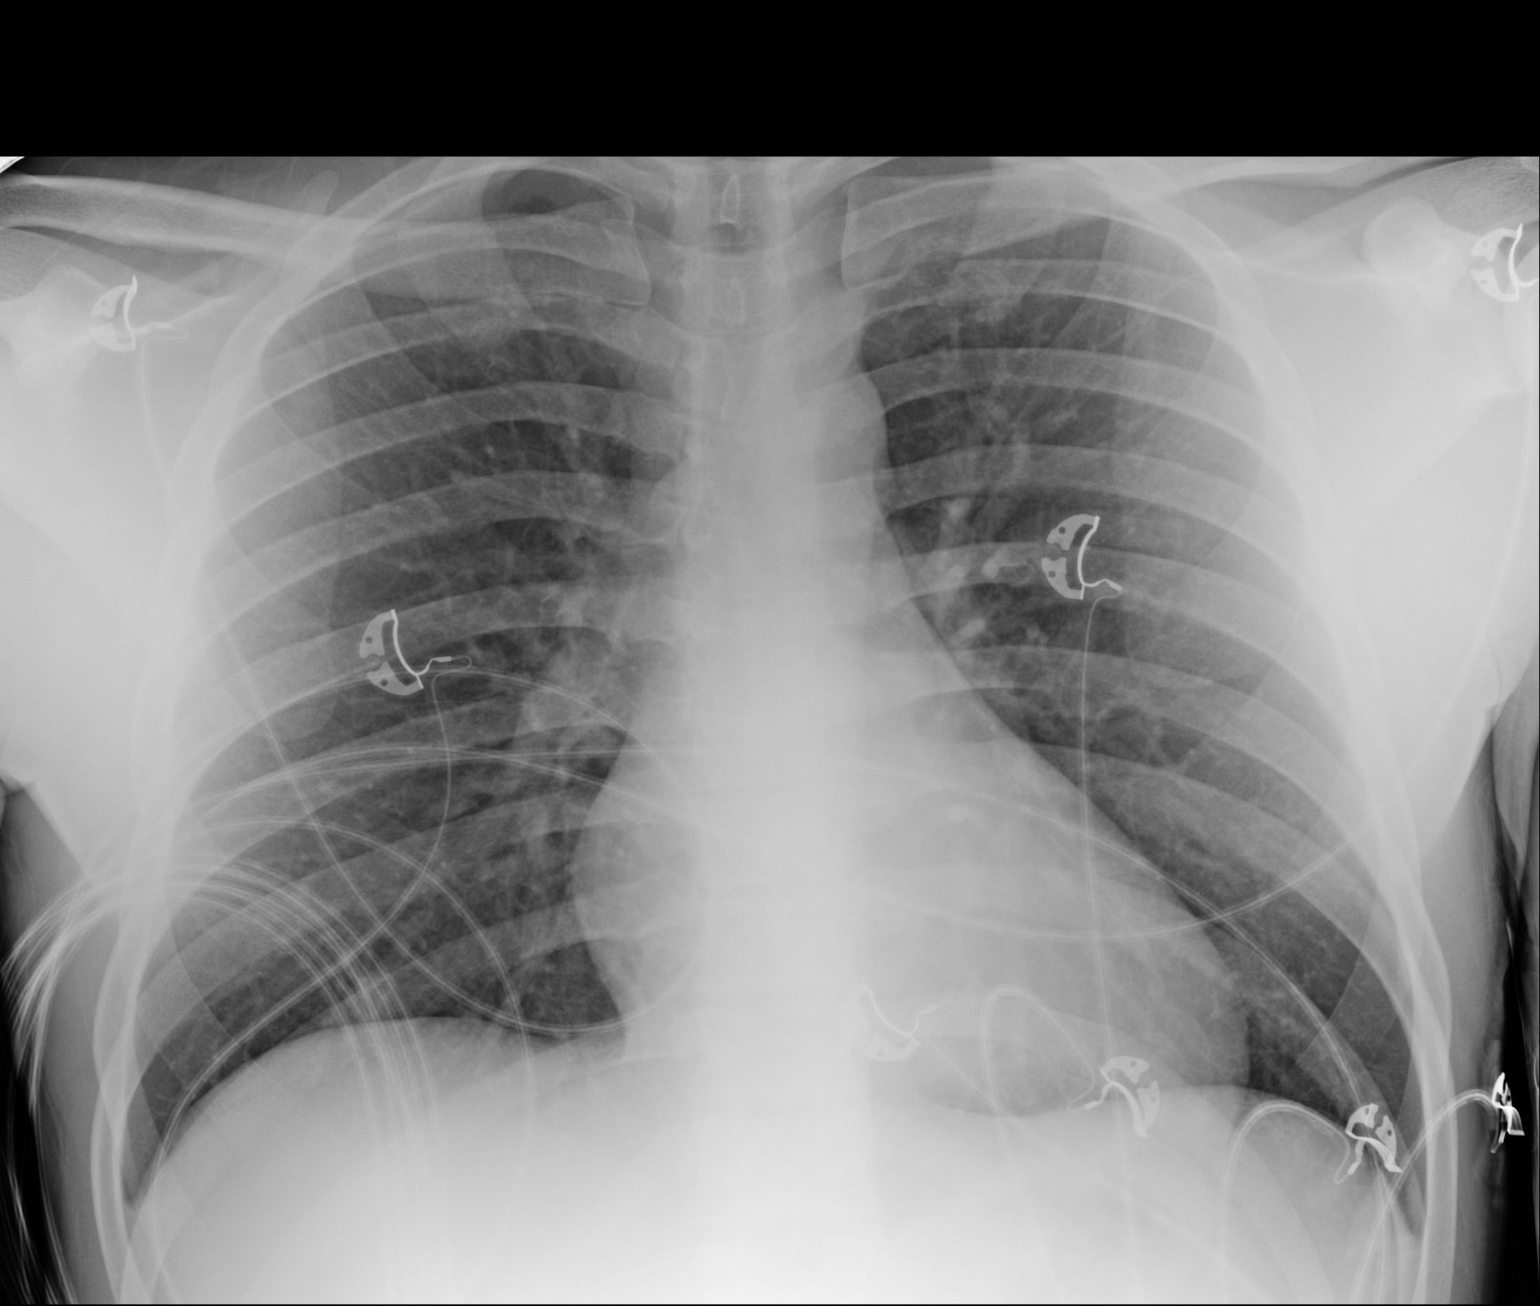

[2 of 2 positions shown; findings below may reference images not displayed]

FINDINGS: The heart size and mediastinal contours are within normal limits.
Both lungs are clear. The visualized skeletal structures are
unremarkable.
IMPRESSION: No active disease.

## 2020-07-08 ENCOUNTER — Encounter: Payer: Self-pay | Admitting: Family Medicine

## 2020-07-08 ENCOUNTER — Ambulatory Visit (INDEPENDENT_AMBULATORY_CARE_PROVIDER_SITE_OTHER): Payer: Self-pay | Admitting: Family Medicine

## 2020-07-08 ENCOUNTER — Other Ambulatory Visit: Payer: Self-pay

## 2020-07-08 VITALS — BP 121/81 | HR 83 | Temp 98.5°F | Ht 69.0 in | Wt 172.6 lb

## 2020-07-08 DIAGNOSIS — R1011 Right upper quadrant pain: Secondary | ICD-10-CM

## 2020-07-08 DIAGNOSIS — G8929 Other chronic pain: Secondary | ICD-10-CM

## 2020-07-08 DIAGNOSIS — Z8711 Personal history of peptic ulcer disease: Secondary | ICD-10-CM

## 2020-07-08 DIAGNOSIS — R112 Nausea with vomiting, unspecified: Secondary | ICD-10-CM

## 2020-07-08 MED ORDER — ONDANSETRON 8 MG PO TBDP: 8.0000 mg | ORAL_TABLET | Freq: Three times a day (TID) | ORAL | 0 refills | Status: DC | PRN

## 2020-07-08 MED ORDER — PANTOPRAZOLE SODIUM 20 MG PO TBEC: 20.0000 mg | DELAYED_RELEASE_TABLET | Freq: Every day | ORAL | 0 refills | Status: DC

## 2020-07-08 MED ORDER — SUCRALFATE 1 G PO TABS: 1.0000 g | ORAL_TABLET | Freq: Three times a day (TID) | ORAL | 0 refills | Status: DC

## 2020-07-08 NOTE — Progress Notes (Signed)
Acute Office Visit  Subjective:    Patient ID: Alex Oneill, male    DOB: May 20, 1995, 25 y.o.   MRN: [id number]  Chief Complaint  Patient presents with   Abdominal Pain    HPI Patient is in today for abdominal pain for about 1 years. The pain is feels like cramping and sometimes like sharp pain. The pain is mostly in his upper right quadrant. He feels nauseous daily. He sometimes has vomiting after eating and he vomits each morning. He sometimes has blood with his emesis. This has happened just a few times. It has been small amounts. This last occurred 2 months ago. He denies heartburn. He occasionally has diarrhea. Denies constipation. Eating makes the pain worse, it doesn't seem to matter what types of foods. Denies fever, weight loss, blood in stoll. He has tried omeprazole without improvement. Fruits and smoothies seem to make the pain better. Laying on his stomach for long periods also makes the pain.  He does take naproxen BID and has for a year or so.   Past Medical History:  Diagnosis Date   Asthma    Heart murmur     Past Surgical History:  Procedure Laterality Date   KNEE ARTHROPLASTY Left    TONSILLECTOMY      Family History  Problem Relation Age of Onset   Breast cancer Mother     Social History   Socioeconomic History   Marital status: Single    Spouse name: Not on file   Number of children: Not on file   Years of education: Not on file   Highest education level: Not on file  Occupational History   Not on file  Tobacco Use   Smoking status: Never   Smokeless tobacco: Never  Vaping Use   Vaping Use: Never used  Substance and Sexual Activity   Alcohol use: No   Drug use: No   Sexual activity: Yes  Other Topics Concern   Not on file  Social History Narrative   Not on file   Social Determinants of Health   Financial Resource Strain: Not on file  Food Insecurity: Not on file  Transportation Needs: Not on file  Physical Activity: Not on file   Stress: Not on file  Social Connections: Not on file  Intimate Partner Violence: Not on file    Outpatient Medications Prior to Visit  Medication Sig Dispense Refill   albuterol (VENTOLIN HFA) 108 (90 Base) MCG/ACT inhaler Inhale 1-2 puffs into the lungs every 6 (six) hours as needed for wheezing or shortness of breath.     naproxen (NAPROSYN) 500 MG tablet Take 1 tablet (500 mg total) by mouth 2 (two) times daily with a meal. 60 tablet 1   omeprazole (PRILOSEC) 20 MG capsule Take by mouth.     No facility-administered medications prior to visit.    Allergies  Allergen Reactions   Penicillins     Did it involve swelling of the face/tongue/throat, SOB, or low BP? No Did it involve sudden or severe rash/hives, skin peeling, or any reaction on the inside of your mouth or nose? No Did you need to seek medical attention at a hospital or doctor's office? No When did it last happen?       If all above answers are "NO", may proceed with cephalosporin use.     Review of Systems As per HPI.     Objective:    Physical Exam Vitals and nursing note reviewed.  Constitutional:  General: He is not in acute distress.    Appearance: He is not ill-appearing, toxic-appearing or diaphoretic.  Cardiovascular:     Rate and Rhythm: Normal rate and regular rhythm.     Heart sounds: Normal heart sounds. No murmur heard. Pulmonary:     Effort: Pulmonary effort is normal. No respiratory distress.     Breath sounds: Normal breath sounds.  Abdominal:     General: Abdomen is flat. Bowel sounds are normal. There is no distension or abdominal bruit. There are no signs of injury.     Palpations: Abdomen is soft. There is no shifting dullness, fluid wave, hepatomegaly or pulsatile mass.     Tenderness: There is abdominal tenderness in the right upper quadrant. There is no guarding or rebound. Negative signs include Murphy's sign, Rovsing's sign and McBurney's sign.     Hernia: No hernia is present.   Skin:    General: Skin is warm and dry.  Neurological:     General: No focal deficit present.     Mental Status: He is alert.  Psychiatric:        Mood and Affect: Mood normal.        Behavior: Behavior normal.    BP 121/81   Pulse 83   Temp 98.5 F (36.9 C) (Temporal)   Ht 5' 9"  (1.753 m)   Wt 172 lb 9.6 oz (78.3 kg)   SpO2 97%   BMI 25.49 kg/m  Wt Readings from Last 3 Encounters:  07/08/20 172 lb 9.6 oz (78.3 kg)  01/03/19 179 lb (81.2 kg)  10/21/18 170 lb (77.1 kg)    Health Maintenance Due  Topic Date Due   HPV VACCINES (1 - Male 2-dose series) Never done   HIV Screening  Never done   Hepatitis C Screening  Never done   TETANUS/TDAP  07/18/2017       Topic Date Due   HPV VACCINES (1 - Male 2-dose series) Never done     No results found for: TSH Lab Results  Component Value Date   WBC 6.5 11/19/2015   HGB 14.3 11/19/2015   HCT 41.7 11/19/2015   MCV 75.7 (L) 11/19/2015   PLT 154 11/19/2015   Lab Results  Component Value Date   NA 138 11/19/2015   K 3.4 (L) 11/19/2015   CO2 28 11/19/2015   GLUCOSE 96 11/19/2015   BUN 12 11/19/2015   CREATININE 1.07 11/19/2015   BILITOT 1.3 (H) 11/19/2015   ALKPHOS 28 (L) 11/19/2015   AST 18 11/19/2015   ALT 15 (L) 11/19/2015   PROT 7.0 11/19/2015   ALBUMIN 4.4 11/19/2015   CALCIUM 9.2 11/19/2015   ANIONGAP 7 11/19/2015   No results found for: CHOL No results found for: HDL No results found for: LDLCALC No results found for: TRIG No results found for: CHOLHDL No results found for: HGBA1C     Assessment & Plan:   Alex Oneill was seen today for abdominal pain.  Diagnoses and all orders for this visit:  Chronic right upper quadrant pain History of peptic ulcer Non-intractable vomiting with nausea, unspecified vomiting type Start carafate and protonix. Stop naproxen, avoid all NSAIDs. Labs pending as below. Zofran as needed for nausea. Referral to GI placed.  -     sucralfate (CARAFATE) 1 g tablet; Take 1  tablet (1 g total) by mouth 4 (four) times daily -  with meals and at bedtime. -     pantoprazole (PROTONIX) 20 MG tablet; Take 1 tablet (20  mg total) by mouth daily. -     Ambulatory referral to Gastroenterology -     CBC with Differential/Platelet -     CMP14+EGFR -     ondansetron (ZOFRAN ODT) 8 MG disintegrating tablet; Take 1 tablet (8 mg total) by mouth every 8 (eight) hours as needed for nausea or vomiting.   Return in about 4 weeks (around 08/05/2020) for PCP for chronic follow up.  The patient indicates understanding of these issues and agrees with the plan.  Gwenlyn Perking, FNP

## 2020-07-08 NOTE — Patient Instructions (Signed)
Peptic Ulcer  A peptic ulcer is a sore in the lining of the stomach (gastric ulcer) or the first part of the small intestine (duodenal ulcer). The ulcer causes a gradual wearing away (erosion) of the deeper tissue. What are the causes? Normally, the lining of the stomach and the small intestine protects them from the acid that digests food. The protective lining can be damaged by: An infection caused by a type of bacteria called Helicobacter pylori or H. pylori. Regular use of NSAIDs, such as ibuprofen or aspirin. Rare tumors in the stomach, small intestine, or pancreas (Zollinger-Ellison syndrome). What increases the risk? The following factors may make you more likely to develop this condition: Smoking. Having a family history of ulcer disease. Drinking alcohol. Having been hospitalized in an intensive care unit (ICU). What are the signs or symptoms? Symptoms of this condition include: Persistent burning pain in the area between the chest and the belly button. The pain may be worse on an empty stomach and at night. Heartburn. Nausea and vomiting. Bloating. If the ulcer results in bleeding, it can cause: Black, tarry stools. Vomiting of bright red blood. Vomiting of material that looks like coffee grounds. How is this diagnosed? This condition may be diagnosed based on: Your medical history and a physical exam. Various tests or procedures, such as: Blood tests, stool tests, or breath tests to check for the H. pylori bacteria. An X-ray exam (upper gastrointestinal series) of the esophagus, stomach, and small intestine. Upper endoscopy. The health care provider examines the esophagus, stomach, and small intestine using a small flexible tube that has a video camera at the end. Biopsy. A tissue sample is removed to be examined under a microscope. How is this treated? Treatment for this condition may include: Eliminating the cause of the ulcer, such as smoking or use of NSAIDs, and  limiting alcohol and caffeine intake. Medicines to reduce the amount of acid in your digestive tract. Antibiotic medicines, if the ulcer is caused by an H. pylori infection. An upper endoscopy may be used to treat a bleeding ulcer. Surgery. This may be needed if the bleeding is severe or if the ulcer created a hole somewhere in the digestive system. Follow these instructions at home: Do not drink alcohol if your health care provider tells you not to drink. Do not use any products that contain nicotine or tobacco, such as cigarettes, e-cigarettes, and chewing tobacco. If you need help quitting, ask your health care provider. Take over-the-counter and prescription medicines only as told by your health care provider. Do not use over-the-counter medicines in place of prescription medicines unless your health care provider approves. Do not take aspirin, ibuprofen, or other NSAIDs unless your health care provider told you to do so. Take over-the-counter and prescription medicines only as told by your health care provider. Keep all follow-up visits as told by your health care provider. This is important. Contact a health care provider if: Your symptoms do not improve within 7 days of starting treatment. You have ongoing indigestion or heartburn. Get help right away if: You have sudden, sharp, or persistent pain in your abdomen. You have bloody or dark black, tarry stools. You vomit blood or material that looks like coffee grounds. You become light-headed or you feel faint. You become weak. You become sweaty or clammy. Summary A peptic ulcer is a sore in the lining of the stomach (gastric ulcer) or the first part of the small intestine (duodenal ulcer). The ulcer causes a gradual wearing   away (erosion) of the deeper tissue. Do not use any products that contain nicotine or tobacco, such as cigarettes, e-cigarettes, and chewing tobacco. If you need help quitting, ask your health care provider. Take  over-the-counter and prescription medicines only as told by your health care provider. Do not use over-the-counter medicines in place of prescription medicines unless your health care provider approves. Contact your health care provider if you have ongoing indigestion or heartburn. Keep all follow-up visits as told by your health care provider. This is important. This information is not intended to replace advice given to you by your health care provider. Make sure you discuss any questions you have with your healthcare provider. Document Revised: 07/18/2017 Document Reviewed: 07/18/2017 Elsevier Patient Education  2022 [employer].

## 2020-07-15 ENCOUNTER — Encounter (INDEPENDENT_AMBULATORY_CARE_PROVIDER_SITE_OTHER): Payer: Self-pay | Admitting: *Deleted

## 2020-08-05 ENCOUNTER — Other Ambulatory Visit: Payer: Self-pay | Admitting: Family Medicine

## 2020-08-05 DIAGNOSIS — R1011 Right upper quadrant pain: Secondary | ICD-10-CM

## 2020-08-05 DIAGNOSIS — G8929 Other chronic pain: Secondary | ICD-10-CM

## 2020-09-23 IMAGING — DX DG SHOULDER 2+V*R*
3 series · 3 of 3 positions shown · non-contrast
Comparison: 10/21/2018

CLINICAL DATA: Injury right shoulder pain for 3 months after
playing football

EXAM:
RIGHT SHOULDER - 2+ VIEW

[shoulder ap]
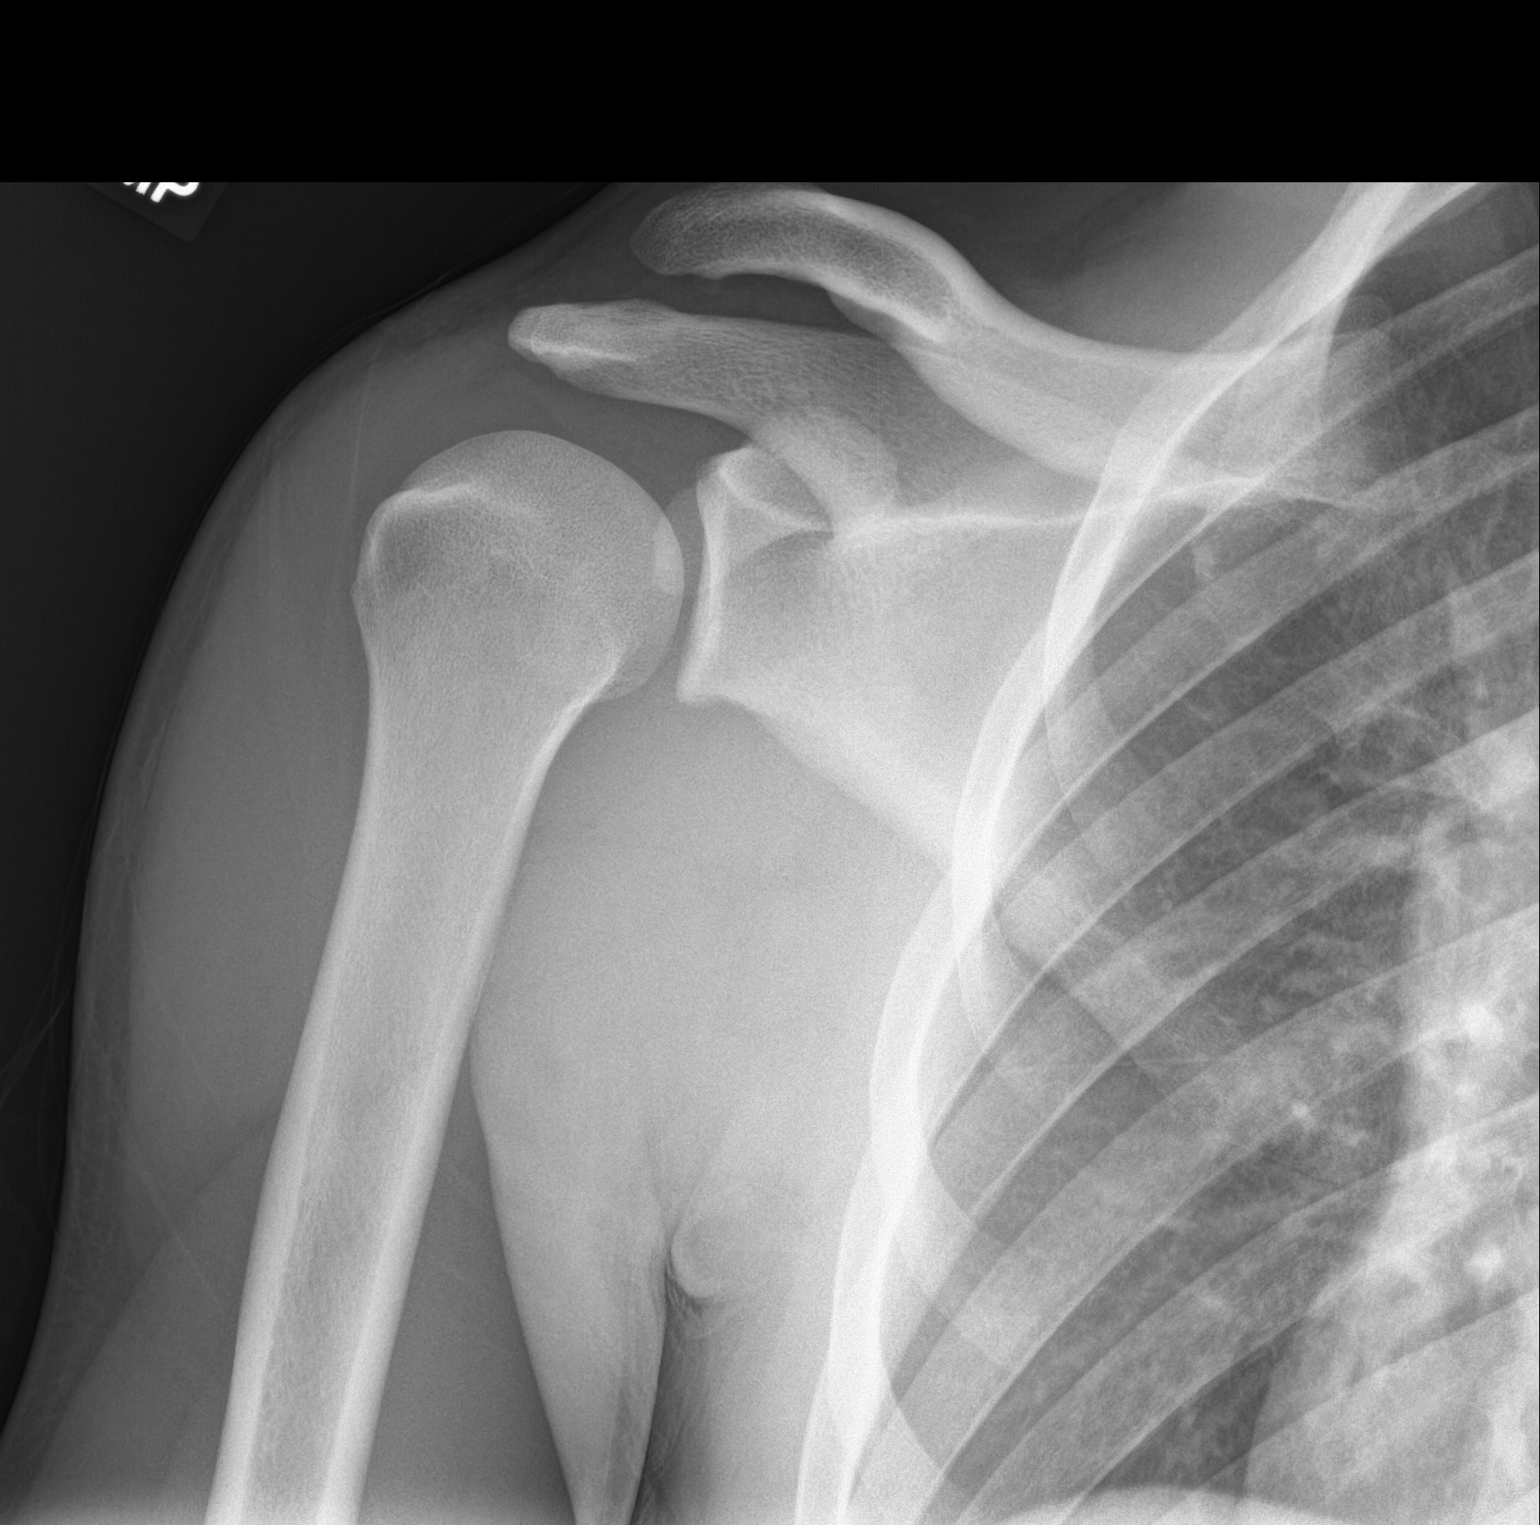

[shoulder obl]
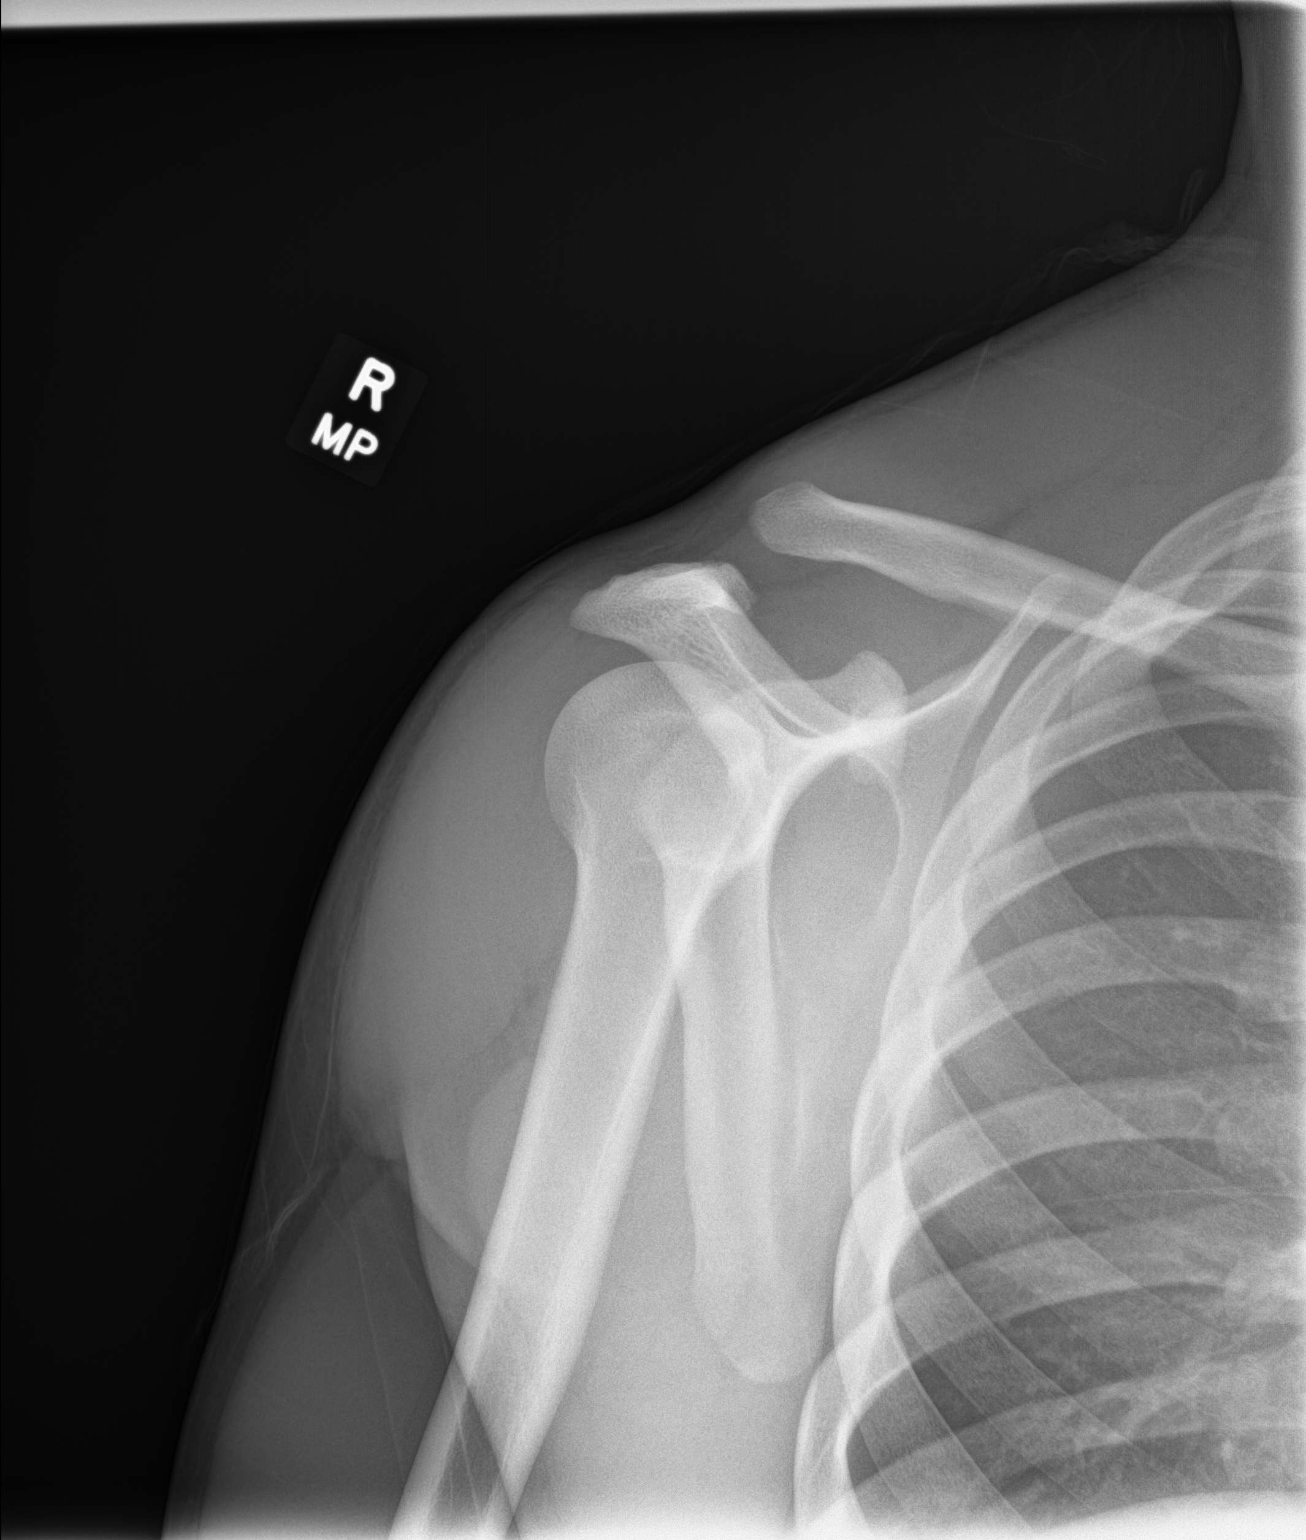

[shoulder axial]
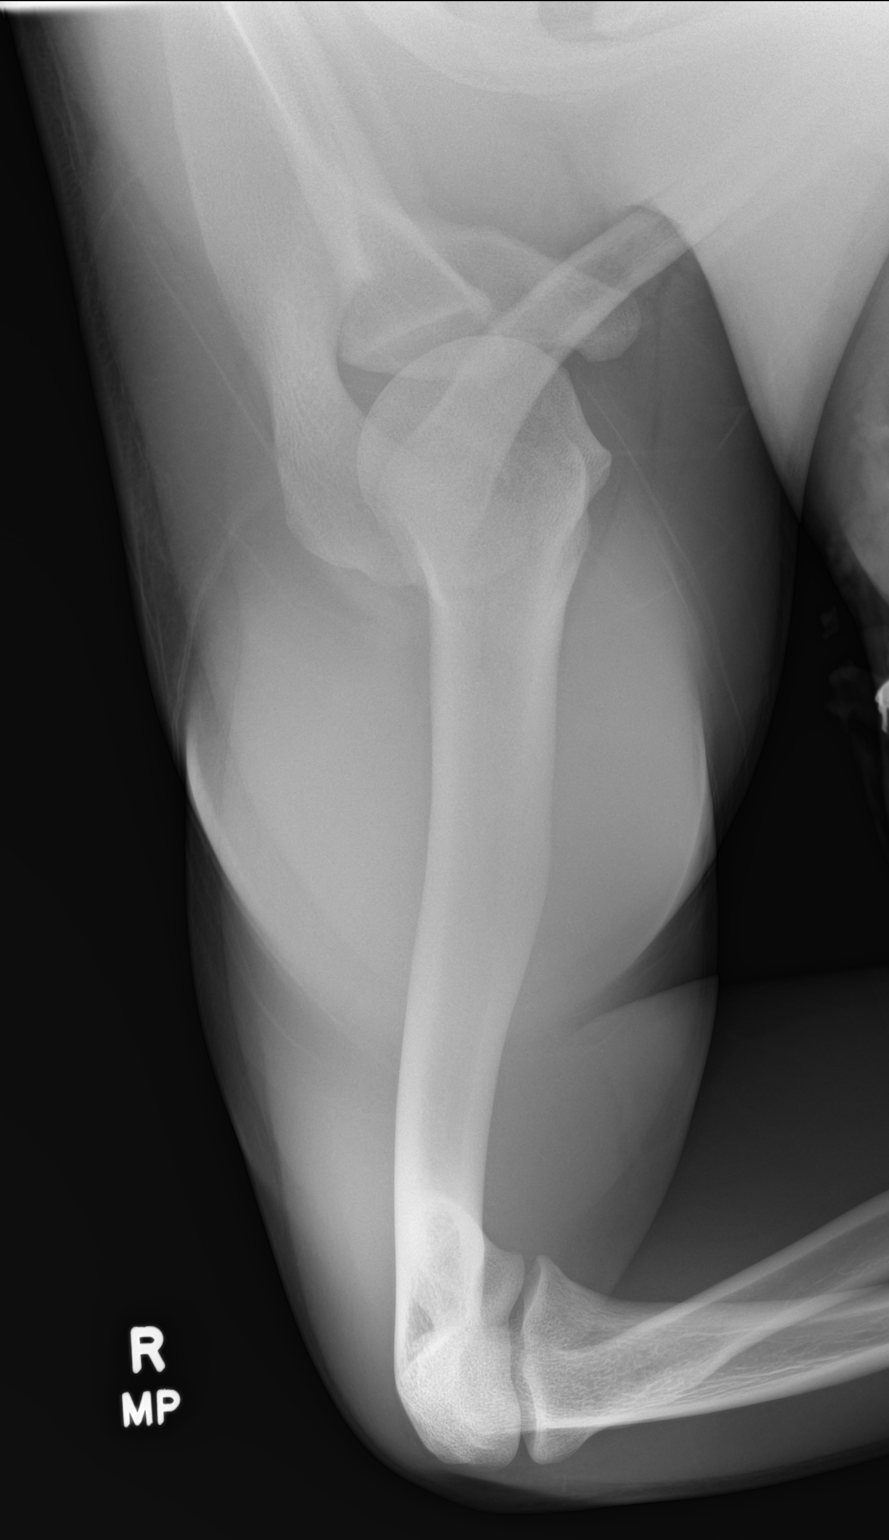

[3 of 3 positions shown; findings below may reference images not displayed]

FINDINGS: Signs of AC joint separation are again noted. Elevation of the
clavicle with respect to the acromion and coracoid process without
change. 16 mm of elevation of the clavicle relative to the coracoid.
Posterior translation of the distal clavicle atop the a chromium is
noted on the axillary view.
IMPRESSION: Type IV AC joint separation, based on previous imaging findings and
with the benefit of axillary view posterior translation of the
distal clavicle into the trapezius, above the acromion.

## 2020-11-12 ENCOUNTER — Ambulatory Visit (INDEPENDENT_AMBULATORY_CARE_PROVIDER_SITE_OTHER): Payer: BC Managed Care – PPO | Admitting: Gastroenterology

## 2020-11-12 ENCOUNTER — Encounter (INDEPENDENT_AMBULATORY_CARE_PROVIDER_SITE_OTHER): Payer: Self-pay | Admitting: Gastroenterology

## 2021-06-16 ENCOUNTER — Ambulatory Visit: Payer: Self-pay | Admitting: Family Medicine

## 2021-06-17 ENCOUNTER — Encounter: Payer: Self-pay | Admitting: Family Medicine

## 2021-06-17 ENCOUNTER — Ambulatory Visit (INDEPENDENT_AMBULATORY_CARE_PROVIDER_SITE_OTHER): Payer: Self-pay | Admitting: Family Medicine

## 2021-06-17 VITALS — BP 133/82 | HR 68 | Temp 98.4°F | Ht 69.0 in | Wt 173.4 lb

## 2021-06-17 DIAGNOSIS — F419 Anxiety disorder, unspecified: Secondary | ICD-10-CM

## 2021-06-17 DIAGNOSIS — F411 Generalized anxiety disorder: Secondary | ICD-10-CM

## 2021-06-17 HISTORY — DX: Anxiety disorder, unspecified: F41.9

## 2021-06-17 MED ORDER — BUSPIRONE HCL 5 MG PO TABS: 5.0000 mg | ORAL_TABLET | Freq: Two times a day (BID) | ORAL | 2 refills | Status: AC

## 2021-06-17 NOTE — Progress Notes (Signed)
Assessment & Plan:  1. Generalized anxiety disorder Uncontrolled. Referring for therapy. Starting BuSpar. - Ambulatory referral to Psychiatry - busPIRone (BUSPAR) 5 MG tablet; Take 1 tablet (5 mg total) by mouth 2 (two) times daily.  Dispense: 60 tablet; Refill: 2   Return in about 6 weeks (around 07/29/2021) for Anxiety.  Alex Boston, MSN, APRN, FNP-C [facility]  Subjective:    Patient ID: Alex Oneill, male    DOB: 04/06/1995, 26 y.o.   MRN: [id number]  Patient Care Team: Gwenlyn Fudge, FNP as PCP - General (Family Medicine)   Chief Complaint:  Chief Complaint  Patient presents with   Anxiety    X 2 years and has gotten worse     HPI: Alex Oneill is a 26 y.o. male presenting on 06/17/2021 for Anxiety (X 2 years and has gotten worse )  Patient reports chronic anxiety that has gotten worse over the past the past two years. He feels he is ready to do something about it.     06/17/2021    4:33 PM 07/08/2020    2:52 PM  GAD 7 : Generalized Anxiety Score  Nervous, Anxious, on Edge 3 0  Control/stop worrying 2 0  Worry too much - different things 2 0  Trouble relaxing 2 0  Restless 0 0  Easily annoyed or irritable 0 0  Afraid - awful might happen 0 0  Total GAD 7 Score 9 0  Anxiety Difficulty Not difficult at all Not difficult at all   New complaints: None   Social history:  Relevant past medical, surgical, family and social history reviewed and updated as indicated. Interim medical history since our last visit reviewed.  Allergies and medications reviewed and updated.  DATA REVIEWED: CHART IN EPIC  ROS: Negative unless specifically indicated above in HPI.    Current Outpatient Medications:    albuterol (VENTOLIN HFA) 108 (90 Base) MCG/ACT inhaler, Inhale 1-2 puffs into the lungs every 6 (six) hours as needed for wheezing or shortness of breath., Disp: , Rfl:    naproxen (NAPROSYN) 500 MG tablet, Take 1 tablet (500 mg total) by  mouth 2 (two) times daily with a meal., Disp: 60 tablet, Rfl: 1   Allergies  Allergen Reactions   Penicillins     Did it involve swelling of the face/tongue/throat, SOB, or low BP? No Did it involve sudden or severe rash/hives, skin peeling, or any reaction on the inside of your mouth or nose? No Did you need to seek medical attention at a hospital or doctor's office? No When did it last happen?       If all above answers are "NO", may proceed with cephalosporin use.    Past Medical History:  Diagnosis Date   Asthma    Heart murmur     Past Surgical History:  Procedure Laterality Date   KNEE ARTHROPLASTY Left    TONSILLECTOMY      Social History   Socioeconomic History   Marital status: Single    Spouse name: Not on file   Number of children: Not on file   Years of education: Not on file   Highest education level: Not on file  Occupational History   Not on file  Tobacco Use   Smoking status: Never   Smokeless tobacco: Never  Vaping Use   Vaping Use: Never used  Substance and Sexual Activity   Alcohol use: No   Drug use: No   Sexual activity: Yes  Other Topics Concern   Not on file  Social History Narrative   Not on file   Social Determinants of Health   Financial Resource Strain: Not on file  Food Insecurity: Not on file  Transportation Needs: Not on file  Physical Activity: Not on file  Stress: Not on file  Social Connections: Not on file  Intimate Partner Violence: Not on file        Objective:    BP 133/82   Pulse 68   Temp 98.4 F (36.9 C) (Temporal)   Ht 5\' 9"  (1.753 m)   Wt 173 lb 6.4 oz (78.7 kg)   SpO2 99%   BMI 25.61 kg/m   Wt Readings from Last 3 Encounters:  06/17/21 173 lb 6.4 oz (78.7 kg)  07/08/20 172 lb 9.6 oz (78.3 kg)  01/03/19 179 lb (81.2 kg)    Physical Exam Vitals reviewed.  Constitutional:      General: He is not in acute distress.    Appearance: Normal appearance. He is not ill-appearing, toxic-appearing or  diaphoretic.  HENT:     Head: Normocephalic and atraumatic.  Eyes:     General: No scleral icterus.       Right eye: No discharge.        Left eye: No discharge.     Conjunctiva/sclera: Conjunctivae normal.  Cardiovascular:     Rate and Rhythm: Normal rate.  Pulmonary:     Effort: Pulmonary effort is normal. No respiratory distress.  Musculoskeletal:        General: Normal range of motion.     Cervical back: Normal range of motion.  Skin:    General: Skin is warm and dry.  Neurological:     Mental Status: He is alert and oriented to person, place, and time. Mental status is at baseline.  Psychiatric:        Mood and Affect: Mood normal.        Behavior: Behavior normal.        Thought Content: Thought content normal.        Judgment: Judgment normal.    No results found for: TSH Lab Results  Component Value Date   WBC 6.5 11/19/2015   HGB 14.3 11/19/2015   HCT 41.7 11/19/2015   MCV 75.7 (L) 11/19/2015   PLT 154 11/19/2015   Lab Results  Component Value Date   NA 138 11/19/2015   K 3.4 (L) 11/19/2015   CO2 28 11/19/2015   GLUCOSE 96 11/19/2015   BUN 12 11/19/2015   CREATININE 1.07 11/19/2015   BILITOT 1.3 (H) 11/19/2015   ALKPHOS 28 (L) 11/19/2015   AST 18 11/19/2015   ALT 15 (L) 11/19/2015   PROT 7.0 11/19/2015   ALBUMIN 4.4 11/19/2015   CALCIUM 9.2 11/19/2015   ANIONGAP 7 11/19/2015   No results found for: CHOL No results found for: HDL No results found for: LDLCALC No results found for: TRIG No results found for: CHOLHDL No results found for: 11/21/2015

## 2021-07-05 ENCOUNTER — Encounter: Payer: Self-pay | Admitting: Family Medicine

## 2021-07-05 ENCOUNTER — Ambulatory Visit (INDEPENDENT_AMBULATORY_CARE_PROVIDER_SITE_OTHER): Payer: Self-pay | Admitting: Family Medicine

## 2021-07-05 VITALS — Temp 99.0°F | Ht 69.0 in | Wt 169.1 lb

## 2021-07-05 DIAGNOSIS — R0683 Snoring: Secondary | ICD-10-CM

## 2021-07-05 DIAGNOSIS — R1110 Vomiting, unspecified: Secondary | ICD-10-CM

## 2021-07-05 DIAGNOSIS — R42 Dizziness and giddiness: Secondary | ICD-10-CM

## 2021-07-05 DIAGNOSIS — R5383 Other fatigue: Secondary | ICD-10-CM

## 2021-07-05 DIAGNOSIS — R002 Palpitations: Secondary | ICD-10-CM

## 2021-07-05 MED ORDER — ONDANSETRON 4 MG PO TBDP: 4.0000 mg | ORAL_TABLET | Freq: Once | ORAL | Status: AC

## 2021-07-05 NOTE — Progress Notes (Deleted)
   Acute Office Visit  Subjective:     Patient ID: Alex Oneill, male    DOB: 1995/09/12, 26 y.o.   MRN: [id number]  Chief Complaint  Patient presents with   Fatigue    HPI Patient is in today for ***  ROS      Objective:    Temp 99 F (37.2 C) (Temporal)   Ht 5\' 9"  (1.753 m)   Wt 169 lb 2 oz (76.7 kg)   SpO2 98%   BMI 24.98 kg/m  {Vitals History (Optional):23777}  Physical Exam  No results found for any visits on 07/05/21.      Assessment & Plan:   Problem List Items Addressed This Visit   None   No orders of the defined types were placed in this encounter.   No follow-ups on file.  09/04/21, FNP

## 2021-07-05 NOTE — Progress Notes (Signed)
Acute Office Visit  Subjective:     Patient ID: Alex Oneill, male    DOB: [birth year], 26 y.o.   MRN: [id number]  Chief Complaint  Patient presents with   Fatigue    HPI Patient is in today for loss of energy for the past month. He states it improves with rest. Sekou states he feels dizzy when he stands up quickly. States he is well hydrated, good balanced diet and active in sports. Herny states he has been told he snores loudly at night and sometimes gasps when sleeping. He will struggle to stay awake if he sits down for a few minutes. He denies bleeding, changes in weight, fever, or night sweats. He has a history of palpitations. He reports that they occur intermittently and will improve with rest. He has been evaluated by cardiology in the past. He was previously on medication for this, but not in the last year. He didn't notice an improvement with the medication.   Review of Systems  Constitutional:  Negative for chills, fever and weight loss.  HENT:  Negative for ear discharge, ear pain, hearing loss, sore throat and tinnitus.   Eyes:  Negative for blurred vision and double vision.  Respiratory:  Negative for cough, shortness of breath and wheezing.   Cardiovascular:  Positive for palpitations (intermiten, improves with rest breaks). Negative for chest pain.  Gastrointestinal:  Negative for abdominal pain, constipation, diarrhea and heartburn.  Genitourinary:  Negative for dysuria, frequency and urgency.  Musculoskeletal:  Negative for back pain, joint pain and neck pain.  Skin:  Negative for itching and rash.  Neurological:  Negative for dizziness, sensory change and speech change.  Endo/Heme/Allergies:  Negative for environmental allergies and polydipsia. Does not bruise/bleed easily.  Psychiatric/Behavioral:  Negative for depression, memory loss, substance abuse and suicidal ideas. The patient is nervous/anxious (taking Buspar daily and notes an improvement).          Objective:    Temp 99 F (37.2 C) (Temporal)   Ht 5' 9" (1.753 m)   Wt 76.7 kg   SpO2 98%   BMI 24.98 kg/m  BP Readings from Last 3 Encounters:  06/17/21 133/82  07/08/20 121/81  01/03/19 131/87    Physical Exam Constitutional:      General: He is not in acute distress.    Appearance: Normal appearance. He is normal weight.  HENT:     Head: Normocephalic and atraumatic.     Right Ear: Tympanic membrane, ear canal and external ear normal.     Left Ear: Tympanic membrane, ear canal and external ear normal.     Nose: Nose normal.     Mouth/Throat:     Mouth: Mucous membranes are moist.  Eyes:     Conjunctiva/sclera: Conjunctivae normal.     Pupils: Pupils are equal, round, and reactive to light.  Neck:     Thyroid: No thyroid mass, thyromegaly or thyroid tenderness.  Cardiovascular:     Rate and Rhythm: Normal rate and regular rhythm.     Pulses: Normal pulses.     Heart sounds: Normal heart sounds.  Pulmonary:     Effort: Pulmonary effort is normal.     Breath sounds: Normal breath sounds.  Abdominal:     General: Abdomen is flat. Bowel sounds are normal.     Palpations: Abdomen is soft.  Musculoskeletal:        General: Normal range of motion.     Cervical back: Normal range of motion and  neck supple.     Right lower leg: No edema.     Left lower leg: No edema.  Skin:    General: Skin is warm and dry.     Capillary Refill: Capillary refill takes less than 2 seconds.  Neurological:     Mental Status: He is alert and oriented to person, place, and time.  Psychiatric:        Mood and Affect: Mood normal.        Behavior: Behavior normal.        Thought Content: Thought content normal.        Judgment: Judgment normal.     No results found for any visits on 07/05/21.      Assessment & Plan:   Iokepa was seen today for fatigue.  Diagnoses and all orders for this visit:  Dizziness Negative orthostatics today. Benign exam. Labs pending as below.  -      CBC with Differential/Platelet -     CMP14+EGFR -     Thyroid Panel With TSH  Palpitations Patient will complete records release for cardiology office. Labs pending as below.  -     CBC with Differential/Platelet -     CMP14+EGFR -     Thyroid Panel With TSH  Other fatigue Will check labs as below. Also reports chronic loud snoring with gasping. Referral to sleep studies placed for evaluation for OSA.  -     CBC with Differential/Platelet -     CMP14+EGFR -     Thyroid Panel With TSH -     Ambulatory referral to Sleep Studies  Loud snoring -     Ambulatory referral to Sleep Studies  Vomiting, unspecified vomiting type, unspecified whether nausea present Vomiting occurred with lab draw. Patient reports that this happens when he has labs drawn. Zofran given today in office.  -     ondansetron (ZOFRAN-ODT) disintegrating tablet 4 mg  Return to office for new or worsening symptoms, or if symptoms persist. Keep follow up with PCP.    Kathlen Mody, RN  I personally was present during the history, physical exam, and medical decision-making activities of this service and have verified that the service and findings are accurately documented in the nurse practitioner student's note.  Marjorie Smolder, FNP

## 2021-07-06 LAB — CBC WITH DIFFERENTIAL/PLATELET
Basophils Absolute: 0 10*3/uL (ref 0.0–0.2)
Basos: 0 %
EOS (ABSOLUTE): 0.1 10*3/uL (ref 0.0–0.4)
Eos: 1 %
Hematocrit: 42.9 % (ref 37.5–51.0)
Hemoglobin: 14.5 g/dL (ref 13.0–17.7)
Immature Grans (Abs): 0 10*3/uL (ref 0.0–0.1)
Immature Granulocytes: 0 %
Lymphocytes Absolute: 2.4 10*3/uL (ref 0.7–3.1)
Lymphs: 24 %
MCH: 25.9 pg — ABNORMAL LOW (ref 26.6–33.0)
MCHC: 33.8 g/dL (ref 31.5–35.7)
MCV: 77 fL — ABNORMAL LOW (ref 79–97)
Monocytes Absolute: 0.6 10*3/uL (ref 0.1–0.9)
Monocytes: 6 %
Neutrophils Absolute: 7 10*3/uL (ref 1.4–7.0)
Neutrophils: 69 %
Platelets: 199 10*3/uL (ref 150–450)
RBC: 5.59 x10E6/uL (ref 4.14–5.80)
RDW: 12.8 % (ref 11.6–15.4)
WBC: 10.2 10*3/uL (ref 3.4–10.8)

## 2021-07-06 LAB — CMP14+EGFR
ALT: 9 IU/L (ref 0–44)
AST: 14 IU/L (ref 0–40)
Albumin/Globulin Ratio: 1.8 (ref 1.2–2.2)
Albumin: 4.9 g/dL (ref 4.1–5.2)
Alkaline Phosphatase: 36 IU/L — ABNORMAL LOW (ref 44–121)
BUN/Creatinine Ratio: 11 (ref 9–20)
BUN: 12 mg/dL (ref 6–20)
Bilirubin Total: 0.9 mg/dL (ref 0.0–1.2)
CO2: 25 mmol/L (ref 20–29)
Calcium: 9.7 mg/dL (ref 8.7–10.2)
Chloride: 101 mmol/L (ref 96–106)
Creatinine, Ser: 1.11 mg/dL (ref 0.76–1.27)
Globulin, Total: 2.7 g/dL (ref 1.5–4.5)
Glucose: 105 mg/dL — ABNORMAL HIGH (ref 70–99)
Potassium: 3.7 mmol/L (ref 3.5–5.2)
Sodium: 141 mmol/L (ref 134–144)
Total Protein: 7.6 g/dL (ref 6.0–8.5)
eGFR: 93 mL/min/{1.73_m2} (ref >59)

## 2021-07-06 LAB — THYROID PANEL WITH TSH
Free Thyroxine Index: 3.4 (ref 1.2–4.9)
T3 Uptake Ratio: 28 % (ref 24–39)
T4, Total: 12 ug/dL (ref 4.5–12.0)
TSH: 1.06 u[IU]/mL (ref 0.450–4.500)

## 2021-07-29 ENCOUNTER — Ambulatory Visit: Payer: Self-pay | Admitting: Family Medicine

## 2021-07-30 ENCOUNTER — Encounter: Payer: Self-pay | Admitting: Family Medicine

## 2021-09-02 ENCOUNTER — Institutional Professional Consult (permissible substitution): Payer: Self-pay | Admitting: Pulmonary Disease
